# Patient Record
Sex: Female | Born: 1955 | Race: White | Hispanic: No | Marital: Married | Smoking: Never smoker
Health system: Southern US, Community
[De-identification: ages and names within clinical notes are randomized; demographics above are authoritative.]

## PROBLEM LIST (undated history)

## (undated) DIAGNOSIS — F329 Major depressive disorder, single episode, unspecified: Secondary | ICD-10-CM

## (undated) DIAGNOSIS — M2550 Pain in unspecified joint: Secondary | ICD-10-CM

## (undated) DIAGNOSIS — F419 Anxiety disorder, unspecified: Secondary | ICD-10-CM

## (undated) DIAGNOSIS — Z9889 Other specified postprocedural states: Secondary | ICD-10-CM

## (undated) DIAGNOSIS — C50919 Malignant neoplasm of unspecified site of unspecified female breast: Secondary | ICD-10-CM

## (undated) DIAGNOSIS — G709 Myoneural disorder, unspecified: Secondary | ICD-10-CM

## (undated) DIAGNOSIS — Z98811 Dental restoration status: Secondary | ICD-10-CM

## (undated) DIAGNOSIS — T7840XA Allergy, unspecified, initial encounter: Secondary | ICD-10-CM

## (undated) DIAGNOSIS — R350 Frequency of micturition: Secondary | ICD-10-CM

## (undated) DIAGNOSIS — M8580 Other specified disorders of bone density and structure, unspecified site: Secondary | ICD-10-CM

## (undated) DIAGNOSIS — E079 Disorder of thyroid, unspecified: Secondary | ICD-10-CM

## (undated) DIAGNOSIS — F32A Depression, unspecified: Secondary | ICD-10-CM

## (undated) DIAGNOSIS — K635 Polyp of colon: Secondary | ICD-10-CM

## (undated) DIAGNOSIS — C801 Malignant (primary) neoplasm, unspecified: Secondary | ICD-10-CM

## (undated) DIAGNOSIS — E039 Hypothyroidism, unspecified: Secondary | ICD-10-CM

## (undated) DIAGNOSIS — G35 Multiple sclerosis: Secondary | ICD-10-CM

## (undated) DIAGNOSIS — R112 Nausea with vomiting, unspecified: Secondary | ICD-10-CM

## (undated) HISTORY — PX: LAPAROSCOPIC ABDOMINAL EXPLORATION: SHX6249

## (undated) HISTORY — DX: Other specified disorders of bone density and structure, unspecified site: M85.80

## (undated) HISTORY — DX: Multiple sclerosis: G35

## (undated) HISTORY — DX: Polyp of colon: K63.5

## (undated) HISTORY — PX: POLYPECTOMY: SHX149

## (undated) HISTORY — DX: Allergy, unspecified, initial encounter: T78.40XA

## (undated) HISTORY — DX: Disorder of thyroid, unspecified: E07.9

## (undated) HISTORY — DX: Myoneural disorder, unspecified: G70.9

## (undated) HISTORY — DX: Malignant neoplasm of unspecified site of unspecified female breast: C50.919

## (undated) HISTORY — DX: Depression, unspecified: F32.A

## (undated) HISTORY — DX: Major depressive disorder, single episode, unspecified: F32.9

---

## 2013-07-01 ENCOUNTER — Ambulatory Visit: Payer: 59 | Attending: Psychiatry | Admitting: Physical Therapy

## 2013-07-01 DIAGNOSIS — R269 Unspecified abnormalities of gait and mobility: Secondary | ICD-10-CM | POA: Insufficient documentation

## 2013-07-01 DIAGNOSIS — IMO0001 Reserved for inherently not codable concepts without codable children: Secondary | ICD-10-CM | POA: Insufficient documentation

## 2013-07-01 DIAGNOSIS — M6281 Muscle weakness (generalized): Secondary | ICD-10-CM | POA: Insufficient documentation

## 2013-07-08 ENCOUNTER — Ambulatory Visit: Payer: 59 | Admitting: Physical Therapy

## 2013-07-10 ENCOUNTER — Ambulatory Visit: Payer: 59 | Admitting: Physical Therapy

## 2013-07-13 ENCOUNTER — Ambulatory Visit: Payer: 59 | Admitting: Physical Therapy

## 2013-07-15 ENCOUNTER — Ambulatory Visit: Payer: 59 | Admitting: Physical Therapy

## 2013-07-21 ENCOUNTER — Ambulatory Visit: Payer: 59 | Attending: Psychiatry | Admitting: Physical Therapy

## 2013-07-21 DIAGNOSIS — R269 Unspecified abnormalities of gait and mobility: Secondary | ICD-10-CM | POA: Insufficient documentation

## 2013-07-21 DIAGNOSIS — M6281 Muscle weakness (generalized): Secondary | ICD-10-CM | POA: Insufficient documentation

## 2013-07-24 ENCOUNTER — Ambulatory Visit: Payer: 59 | Admitting: Physical Therapy

## 2013-07-27 ENCOUNTER — Ambulatory Visit: Payer: 59 | Admitting: Physical Therapy

## 2013-07-29 ENCOUNTER — Ambulatory Visit: Payer: 59 | Admitting: Physical Therapy

## 2013-08-03 ENCOUNTER — Ambulatory Visit: Payer: 59 | Admitting: Physical Therapy

## 2013-08-05 ENCOUNTER — Ambulatory Visit: Payer: 59 | Admitting: Physical Therapy

## 2014-01-28 ENCOUNTER — Encounter: Payer: Self-pay | Admitting: Internal Medicine

## 2014-01-28 ENCOUNTER — Other Ambulatory Visit: Payer: Self-pay | Admitting: Internal Medicine

## 2014-01-28 DIAGNOSIS — Z1231 Encounter for screening mammogram for malignant neoplasm of breast: Secondary | ICD-10-CM

## 2014-02-10 ENCOUNTER — Ambulatory Visit: Payer: 59

## 2014-02-17 ENCOUNTER — Ambulatory Visit
Admission: RE | Admit: 2014-02-17 | Discharge: 2014-02-17 | Disposition: A | Discharge status: Home / Self Care | Payer: 59 | Source: Ambulatory Visit | Attending: Internal Medicine | Admitting: Internal Medicine

## 2014-02-17 DIAGNOSIS — Z1231 Encounter for screening mammogram for malignant neoplasm of breast: Secondary | ICD-10-CM

## 2014-03-08 ENCOUNTER — Ambulatory Visit (AMBULATORY_SURGERY_CENTER): Payer: Self-pay | Admitting: *Deleted

## 2014-03-08 ENCOUNTER — Telehealth: Payer: Self-pay | Admitting: *Deleted

## 2014-03-08 VITALS — Ht 62.0 in | Wt 122.4 lb

## 2014-03-08 DIAGNOSIS — Z1211 Encounter for screening for malignant neoplasm of colon: Secondary | ICD-10-CM

## 2014-03-08 MED ORDER — MOVIPREP 100 G PO SOLR: 1.0000 | Freq: Once | ORAL | Status: DC

## 2014-03-08 NOTE — Telephone Encounter (Signed)
Dr Henrene Pastor,  I saw Mrs Wartman in Pre Visit this am. She is concerned about her colonoscopy sedation with diprovan as she had nausea and vomiting with diprovan when she had a laparoscopy. She is uncertain if they added fentanyl to her sedation at that time. She wanted to know if she could have something for nausea prior to her colon or if the CRNA could give her something prior to her procedure IV.   Thanks for your time.   Please advise.  Lelan Pons Pre Visit

## 2014-03-08 NOTE — Telephone Encounter (Signed)
No problem. If somebody reminds me, we'll give her some zofran. Thanks

## 2014-03-08 NOTE — Progress Notes (Signed)
No egg or soy allergy. ewm No diet pills. ewm No blood thinners. ewm No issues with past sedation except w/ her laparoscopy she had nausea vomiting and she knows she had diprovan but unsure if fentanyl was added. ewm No home 02 use. ewm Pt declined emmi. ewm

## 2014-03-08 NOTE — Telephone Encounter (Signed)
Attached pt info sheet to pt's chart to remind That dr .Henrene Pastor. Said zofran ok before her colon. Also called and left message on pt's answering machine of such as directed to do so by pt today in [facility]  [facility]

## 2014-03-17 ENCOUNTER — Encounter: Payer: Self-pay | Admitting: Internal Medicine

## 2014-03-25 ENCOUNTER — Ambulatory Visit (AMBULATORY_SURGERY_CENTER): Payer: 59 | Admitting: Internal Medicine

## 2014-03-25 ENCOUNTER — Encounter: Payer: Self-pay | Admitting: Internal Medicine

## 2014-03-25 VITALS — BP 122/71 | HR 59 | Temp 99.0°F | Resp 16 | Ht 62.0 in | Wt 122.0 lb

## 2014-03-25 DIAGNOSIS — K635 Polyp of colon: Secondary | ICD-10-CM

## 2014-03-25 DIAGNOSIS — D122 Benign neoplasm of ascending colon: Secondary | ICD-10-CM

## 2014-03-25 DIAGNOSIS — Z1211 Encounter for screening for malignant neoplasm of colon: Secondary | ICD-10-CM

## 2014-03-25 HISTORY — PX: COLONOSCOPY WITH PROPOFOL: SHX5780

## 2014-03-25 MED ORDER — SODIUM CHLORIDE 0.9 % IV SOLN: 500.0000 mL | INTRAVENOUS | Status: DC

## 2014-03-25 NOTE — Progress Notes (Signed)
Report to PACU, RN, vss, BBS= Clear.  

## 2014-03-25 NOTE — Progress Notes (Signed)
Called to room to assist during endoscopic procedure.  Patient ID and intended procedure confirmed with present staff. Received instructions for my participation in the procedure from the performing physician.  

## 2014-03-25 NOTE — Patient Instructions (Signed)
YOU HAD AN ENDOSCOPIC PROCEDURE TODAY AT THE [facility]: Refer to the procedure report that was given to you for any specific questions about what was found during the examination.  If the procedure report does not answer your questions, please call your gastroenterologist to clarify.  If you requested that your care partner not be given the details of your procedure findings, then the procedure report has been included in a sealed envelope for you to review at your convenience later.  YOU SHOULD EXPECT: Some feelings of bloating in the abdomen. Passage of more gas than usual.  Walking can help get rid of the air that was put into your GI tract during the procedure and reduce the bloating. If you had a lower endoscopy (such as a colonoscopy or flexible sigmoidoscopy) you may notice spotting of blood in your stool or on the toilet paper. If you underwent a bowel prep for your procedure, then you may not have a normal bowel movement for a few days.  DIET: Your first meal following the procedure should be a light meal and then it is ok to progress to your normal diet.  A half-sandwich or bowl of soup is an example of a good first meal.  Heavy or fried foods are harder to digest and may make you feel nauseous or bloated.  Likewise meals heavy in dairy and vegetables can cause extra gas to form and this can also increase the bloating.  Drink plenty of fluids but you should avoid alcoholic beverages for 24 hours.  ACTIVITY: Your care partner should take you home directly after the procedure.  You should plan to take it easy, moving slowly for the rest of the day.  You can resume normal activity the day after the procedure however you should NOT DRIVE or use heavy machinery for 24 hours (because of the sedation medicines used during the test).    SYMPTOMS TO REPORT IMMEDIATELY: A gastroenterologist can be reached at any hour.  During normal business hours, 8:30 AM to 5:00 PM Monday through Friday,  call [phone].  After hours and on weekends, please call the GI answering service at [phone] who will take a message and have the physician on call contact you.   Following lower endoscopy (colonoscopy or flexible sigmoidoscopy):  Excessive amounts of blood in the stool  Significant tenderness or worsening of abdominal pains  Swelling of the abdomen that is new, acute  Fever of 100F or higher    FOLLOW UP: If any biopsies were taken you will be contacted by phone or by letter within the next 1-3 weeks.  Call your gastroenterologist if you have not heard about the biopsies in 3 weeks.  Our staff will call the home number listed on your records the next business day following your procedure to check on you and address any questions or concerns that you may have at that time regarding the information given to you following your procedure. This is a courtesy call and so if there is no answer at the home number and we have not heard from you through the emergency physician on call, we will assume that you have returned to your regular daily activities without incident.  SIGNATURES/CONFIDENTIALITY: You and/or your care partner have signed paperwork which will be entered into your electronic medical record.  These signatures attest to the fact that that the information above on your After Visit Summary has been reviewed and is understood.  Full responsibility of the confidentiality   of this discharge information lies with you and/or your care-partner.     

## 2014-03-25 NOTE — Op Note (Signed)
[place]  [address] [place], [address]   COLONOSCOPY PROCEDURE REPORT  PATIENT: Amanda Li, Amanda Li  MR#: [id number] BIRTHDATE: July 19, 1955 , 43  yrs. old GENDER: female ENDOSCOPIST: Eustace Quail, MD REFERRED BY:W.  Lutricia Feil, M.D. PROCEDURE DATE:  03/25/2014 PROCEDURE:   Colonoscopy with snare polypectomy x 2 First Screening Colonoscopy - Avg.  risk and is 50 yrs.  old or older Yes.  Prior Negative Screening - Now for repeat screening. N/A  History of Adenoma - Now for follow-up colonoscopy & has been > or = to 3 yrs.  N/A  Polyps Removed Today? Yes. ASA CLASS:   Class II INDICATIONS:average risk for colorectal cancer. MEDICATIONS: Monitored anesthesia care and Propofol 290 mg IV  DESCRIPTION OF PROCEDURE:   After the risks benefits and alternatives of the procedure were thoroughly explained, informed consent was obtained.  The digital rectal exam revealed no abnormalities of the rectum.   The [id number]  endoscope was introduced through the anus and advanced to the cecum, which was identified by both the appendix and ileocecal valve. No adverse events experienced.   The quality of the prep was excellent, using MoviPrep  The instrument was then slowly withdrawn as the colon was fully examined.    COLON FINDINGS: Two polyps, similar appearance, measuring 1 mm in size were found in the ascending colon.  A polypectomy was performed with a cold snare.  The resection was complete, in one of two, the polyp tissue was completely retrieved and sent to histology.   The examination was otherwise normal.  Retroflexed views revealed no abnormalities. The time to cecum=4 minutes 23 seconds.  Withdrawal time=12 minutes 29 seconds.  The scope was withdrawn and the procedure completed.  COMPLICATIONS: There were no immediate complications.  ENDOSCOPIC IMPRESSION: 1.   Two polyps measuring 1 mm in size were found in the ascending colon; polypectomy was  performed with a cold snare 2.   The examination was otherwise normal  RECOMMENDATIONS: 1. Repeat colonoscopy in 5 years if polyp adenomatous; otherwise 10 years  eSigned:  Eustace Quail, MD 03/25/2014 12:41 PM   cc: Janalyn Rouse, MD and The Patient

## 2014-03-26 ENCOUNTER — Telehealth: Payer: Self-pay | Admitting: *Deleted

## 2014-03-26 NOTE — Telephone Encounter (Signed)
Left message that we called for f/u 

## 2014-03-30 ENCOUNTER — Encounter: Payer: Self-pay | Admitting: Internal Medicine

## 2014-12-02 ENCOUNTER — Encounter: Payer: Self-pay | Admitting: Physical Therapy

## 2014-12-02 ENCOUNTER — Ambulatory Visit: Payer: 59 | Attending: Internal Medicine | Admitting: Physical Therapy

## 2014-12-02 DIAGNOSIS — R293 Abnormal posture: Secondary | ICD-10-CM

## 2014-12-02 DIAGNOSIS — R269 Unspecified abnormalities of gait and mobility: Secondary | ICD-10-CM

## 2014-12-02 DIAGNOSIS — R2681 Unsteadiness on feet: Secondary | ICD-10-CM | POA: Diagnosis present

## 2014-12-02 DIAGNOSIS — R531 Weakness: Secondary | ICD-10-CM

## 2014-12-02 DIAGNOSIS — M21372 Foot drop, left foot: Secondary | ICD-10-CM | POA: Insufficient documentation

## 2014-12-02 NOTE — Therapy (Signed)
[facility] [address] [place], [place], [address] Phone: [phone]   Fax:  [phone]  Physical Therapy Evaluation  Patient Details  Name: Amanda Li MRN: [id number] Date of Birth: [birth year] Referring Provider:  Marton Redwood, MD  Encounter Date: 12/02/2014      PT End of Session - 12/02/14 1349    Visit Number 1   Number of Visits 17  eval + 16 visits   Date for PT Re-Evaluation 01/31/15   PT Start Time 0930   PT Stop Time 1015   PT Time Calculation (min) 45 min   Equipment Utilized During Treatment Gait belt   Activity Tolerance Patient tolerated treatment well   Behavior During Therapy [facility] for tasks assessed/performed      Past Medical History  Diagnosis Date  . Allergy     seasonal  . Depression   . Neuromuscular disorder     MS  . Thyroid disease     hypothyroid  . Multiple sclerosis     Past Surgical History  Procedure Laterality Date  . Cesarean section      x2  . Laparoscopic abdominal exploration      for endometriosis    There were no vitals filed for this visit.  Visit Diagnosis:  Abnormality of gait  Abnormal posture  Generalized weakness  Unsteadiness      Subjective Assessment - 12/02/14 0940    Subjective Patient diagnosed with MS in 1988. Most recently, pt reporting episodes of back "locking up" (demonstrated thoracolumbar flexion, R lateral flexion) which has become increasingly more frequent witihn the past 6 months. Pt states, "I've pretty much given up on walking distances." Pt also reports that legs "quiver" when pt is bendning over to pick up object from floor/lower height. Pt sustained facial laceration secondary to fall 2 months ago, which occurred when pt was walking over uneven ground.   Patient is accompained by: Family member  daughter, Wilburn Cornelia   Pertinent History MS (diagnosed in 1988), hypothyroidism, depression   Limitations Walking;House hold  activities   Patient Stated Goals "Anything that can help me walk easier and not get that leaning to one side."   Currently in Pain? No/denies            [facility] PT Assessment - 12/02/14 0001    Assessment   Medical Diagnosis Multiple sclerosis   Onset Date/Surgical Date 05/21/86   Precautions   Precautions Fall   Restrictions   Weight Bearing Restrictions No   Balance Screen   Has the patient fallen in the past 6 months Yes   How many times? 1   Has the patient had a decrease in activity level because of a fear of falling?  Yes   Is the patient reluctant to leave their home because of a fear of falling?  No   Home Environment   Living Environment Private residence   Living Arrangements Spouse/significant other;Children   Available Help at Discharge Family   Type of [place] to enter   Entrance Stairs-Number of Steps 2   [place] - single point   Cognition   Overall Cognitive Status Impaired/Different from baseline   Observation/Other Assessments   Focus on Therapeutic Outcomes (FOTO)  NeuroQOL LE: 34.3   Sensation   Light Touch Appears Intact   Coordination   Gross Motor Movements are Fluid and Coordinated No   Heel Shin Test Smoothness of movement  on LLE impaired as compared with RLE   Posture/Postural Control   Posture/Postural Control Postural limitations   Postural Limitations Rounded Shoulders;Increased thoracic kyphosis   Posture Comments L lateral flexion of cervical spine; lateral trunk shift to R side; bears most of weight on R hip   ROM / Strength   AROM / PROM / Strength Strength   Strength   Overall Strength Deficits   Overall Strength Comments Grossly 4-/5 in L hip, 4/5 in R hip; 4/5 in L knee/ankle in al planes   Bed Mobility   Bed Mobility Supine to Sit;Sit to Supine   Supine to Sit 6: Modified independent (Device/Increase time)   Sit to Supine 6: Modified independent (Device/Increase  time)   Transfers   Transfers Sit to Stand;Stand to Sit   Sit to Stand 6: Modified independent (Device/Increase time)   Stand to Sit 6: Modified independent (Device/Increase time)   Ambulation/Gait   Ambulation/Gait Yes   Ambulation/Gait Assistance 5: Supervision;4: Min guard   Ambulation/Gait Assistance Details min guard with turning, dual tasking, head turning, increased external demands   Ambulation Distance (Feet) 230 Feet   Assistive device None   Gait Pattern Step-through pattern;Decreased dorsiflexion - left;Left genu recurvatum;Lateral hip instability;Trendelenburg;Lateral trunk lean to right  L Trendelenburh with increased distance ambulated   Ambulation Surface Level;Indoor   Gait velocity 2.94 ft/sec   Stairs Yes   Stairs Assistance 4: Min guard   Stair Management Technique Two rails;Alternating pattern;Forwards   Number of Stairs 4   Height of Stairs 6   Standardized Balance Assessment   Standardized Balance Assessment Dynamic Gait Index   Dynamic Gait Index   Level Surface Mild Impairment   Change in Gait Speed Mild Impairment   Gait with Horizontal Head Turns Moderate Impairment   Gait with Vertical Head Turns Mild Impairment   Gait and Pivot Turn Severe Impairment   Step Over Obstacle Mild Impairment   Step Around Obstacles Mild Impairment   Steps Mild Impairment   Total Score 13                           PT Education - 12/02/14 1349    Education provided Yes   Education Details goals, findings, POC.   Person(s) Educated Patient   Methods Explanation   Comprehension Verbalized understanding          PT Short Term Goals - 12/02/14 2221    PT SHORT TERM GOAL #1   Title Pt will perform home exercises with mod I using paper handout to indicate safe daily compliance with HEP. Target date: 12/30/14.   Status New   PT SHORT TERM GOAL #2   Title Pt will demonstrate ability to self-correct posture with subtle cueing to indicate improved  postural awareness. Target date: 12/30/14.   Status New   PT SHORT TERM GOAL #3   Title Pt will increase Dynamic Gait Index score from 13/24 to 17/24 to indicate increased gait stability in presence of external demands. Target date: 12/30/14.   Status New   PT SHORT TERM GOAL #4   Title Pt will negotiate 2 stairs with single L rail and mod I, increased time to enable pt to use all home entrances. Target date: 12/30/14.   Status New           PT Long Term Goals - 12/02/14 2227    PT LONG TERM GOAL #1   Title Pt will increase self-selected gait speed  from 2.94 ft/sec to 3.74 ft/sec to indicate increased efficiency of ambulation. Target date: 01/27/15.   Status New   PT LONG TERM GOAL #2   Title Pt will increase Dynamic Gait Index score from 13 to 20/24 to indicate decreased risk for falls. Target date: 01/27/15.   Status New   PT LONG TERM GOAL #3   Title Pt will ambulate 500' over unlevel, outdoor surfaces with mod I using LRAD to indicate increased stability/independence with community mobility. Target date: 01/27/15.   Status New   PT LONG TERM GOAL #4   Title Pt will independently negotiate curb step and outdoor inclined/declined surfaces with no overt LOB to indicate safety traversing community obstacles. Target date: 01/27/15.   Status New   PT LONG TERM GOAL #5   Title Pt will increase Neuro QoL Lower Extremity score >/= 10 points from baseline to indicate improved quality of life related to LE function. Target date: 01/27/15.   Baseline NeuroQOL LE: 34.3   Status New               Plan - 12/02/14 2203    Clinical Impression Statement Pt is a 59 y/o F with history of multiple sclerosis (diagnoses in 1988) presenting to outpatient PT due to impaired balance, gait instability, and increasingly more impaired postural alignment over the past 6 months. PT evaluation reveals the following functional impairments:gait deviations, abnormal postural alignment/control, weakness (LLE > RLE),  decreased LE motor control, Dynamic Gait Index score indicative of decreased gait stability in presence of external demands; decreased QoL related to functional mobility as demonstrated by Neuro QoL LE score; unsteadiness, as pt has sustained injuries secondary to fall within the past 6 months. Pt will benefit from skilled outpatient PT twice per week for 8 weeks to address said impairments.   Pt will benefit from skilled therapeutic intervention in order to improve on the following deficits Abnormal gait;Decreased activity tolerance;Decreased balance;Decreased coordination;Decreased strength;Impaired perceived functional ability;Improper body mechanics;Postural dysfunction;Decreased mobility;Decreased cognition   Rehab Potential Excellent   PT Frequency 2x / week   PT Duration 8 weeks   PT Treatment/Interventions ADLs/Self Care Home Management;Vestibular;Functional mobility training;Stair training;Gait training;Therapeutic activities;Therapeutic exercise;Balance training;Neuromuscular re-education;Patient/family education;Orthotic Fit/Training;Passive range of motion;Manual techniques;Energy conservation   PT Next Visit Plan Establish HEP; ask about use of L heel wedge to control genu recurvatum. Increase pt awareness of pelvic obliquity, postural alignment. Pelvic PNF.   PT Home Exercise Plan Strengthening of L hip abductors, extensors; resisted L TKE to control genu recurvatum. Stretching to increase symmetry of alignment.   Recommended Other Services Per pt report of recent cognitive changes, pt to consider SLP consult for to address cognition.   Consulted and Agree with Plan of Care Patient;Family member/caregiver   Family Member Consulted daughter, Wilburn Cornelia         Problem List There are no active problems to display for this patient.   Billie Ruddy, PT, DPT [facility] [address] [place], [place], [address] Phone: [phone]   Fax:   [phone] 12/02/2014, 10:41 PM

## 2014-12-07 ENCOUNTER — Ambulatory Visit: Payer: 59 | Admitting: Physical Therapy

## 2014-12-07 DIAGNOSIS — R2681 Unsteadiness on feet: Secondary | ICD-10-CM

## 2014-12-07 DIAGNOSIS — R269 Unspecified abnormalities of gait and mobility: Secondary | ICD-10-CM | POA: Diagnosis not present

## 2014-12-07 DIAGNOSIS — R293 Abnormal posture: Secondary | ICD-10-CM

## 2014-12-07 DIAGNOSIS — R531 Weakness: Secondary | ICD-10-CM

## 2014-12-07 NOTE — Therapy (Signed)
[facility] [address] [place], [place], [address] Phone: [phone]   Fax:  [phone]  Physical Therapy Treatment  Patient Details  Name: Amanda Li MRN: [id number] Date of Birth: [birth year] Referring Provider:  Marton Redwood, MD  Encounter Date: 12/07/2014      PT End of Session - 12/07/14 1606    Visit Number 2   Number of Visits 17   Date for PT Re-Evaluation 01/31/15   Authorization Type [employer]   PT Start Time 1455   PT Stop Time 1536   PT Time Calculation (min) 41 min   Equipment Utilized During Treatment Gait belt   Activity Tolerance Patient tolerated treatment well   Behavior During Therapy [facility] for tasks assessed/performed      Past Medical History  Diagnosis Date  . Allergy     seasonal  . Depression   . Neuromuscular disorder     MS  . Thyroid disease     hypothyroid  . Multiple sclerosis     Past Surgical History  Procedure Laterality Date  . Cesarean section      x2  . Laparoscopic abdominal exploration      for endometriosis    There were no vitals filed for this visit.  Visit Diagnosis:  Abnormality of gait  Abnormal posture  Generalized weakness  Unsteadiness      Subjective Assessment - 12/07/14 1502    Subjective Pt reporting R ankle pain (see below), which orthopedist diagnosed as posterior tibial tendon dysfunction 2 years ago. Pt reporting incresed fatigue today, stating, "My husband won't let me put the thermostat below 78 degrees. He knows the effect it has on m, bbut doesn't want to spend the money."    Pertinent History MS (diagnosed in 1988), hypothyroidism, depression   Limitations Walking;House hold activities   Patient Stated Goals "Anything that can help me walk easier and not get that leaning to one side."   Currently in Pain? Yes   Pain Score 5    Pain Location Ankle   Pain Orientation Right;Medial;Posterior   Pain Descriptors /  Indicators Sharp   Pain Type Chronic pain   Pain Onset More than a month ago  at least 2 years   Pain Frequency Intermittent   Aggravating Factors  Worst in the morning   Pain Relieving Factors Walking, taking Aleve         Treatment   Therapeutic Exercises: - Instructed pt in home exercises. See Pt Instructions for details. Pt performed said exercises with verbal, demonstration cueing for proper technique. All repetitions performed to pt fatigue. See Patient Instructions for rep/set number, further detail on all exercises. - During VOR x1 viewing with horizontal and vertical head turns, pt reporting blurring of visual target. Pt also reporting diplopia with head turn to R (endrange).                    [facility] PT Treatment/Exercise - 12/07/14 1603    Bed Mobility   Bed Mobility Supine to Sit;Sit to Supine   Supine to Sit 6: Modified independent (Device/Increase time)   Sit to Supine 6: Modified independent (Device/Increase time)   Transfers   Transfers Sit to Stand;Stand to Sit   Sit to Stand 6: Modified independent (Device/Increase time)   Stand to Sit 6: Modified independent (Device/Increase time)   Ambulation/Gait   Ambulation/Gait Yes   Ambulation/Gait Assistance 5: Supervision;4: Min guard   Ambulation Distance (Feet) 180 Feet  Assistive device None   Gait Pattern Step-through pattern;Decreased dorsiflexion - left;Left genu recurvatum;Lateral hip instability;Trendelenburg;Lateral trunk lean to right;Trunk rotated posteriorly on left;Decreased hip/knee flexion - right;Decreased hip/knee flexion - left;Trunk flexed;Poor foot clearance - left  L Trendelenburg; B hips internally rotated   Ambulation Surface Level;Indoor   Posture/Postural Control   Posture/Postural Control Postural limitations   Postural Limitations Rounded Shoulders;Increased thoracic kyphosis   Posture Comments L lateral flexion of cervical spine; lateral trunk shift to R side; bears most of  weight on R hip                PT Education - 12/07/14 1603    Education provided Yes   Education Details HEP. See pt instructions.   Person(s) Educated Patient   Methods Explanation;Demonstration;Verbal cues;Handout   Comprehension Verbalized understanding;Returned demonstration          PT Short Term Goals - 12/07/14 1613    PT SHORT TERM GOAL #1   Title Pt will perform home exercises with mod I using paper handout to indicate safe daily compliance with HEP. Target date: 12/30/14.   Status On-going   PT SHORT TERM GOAL #2   Title Pt will demonstrate ability to self-correct posture with subtle cueing to indicate improved postural awareness. Target date: 12/30/14.   Status On-going   PT SHORT TERM GOAL #3   Title Pt will increase Dynamic Gait Index score from 13/24 to 17/24 to indicate increased gait stability in presence of external demands. Target date: 12/30/14.   Status On-going   PT SHORT TERM GOAL #4   Title Pt will negotiate 2 stairs with single L rail and mod I, increased time to enable pt to use all home entrances. Target date: 12/30/14.   Status On-going           PT Long Term Goals - 12/07/14 1613    PT LONG TERM GOAL #1   Title Pt will increase self-selected gait speed from 2.94 ft/sec to 3.74 ft/sec to indicate increased efficiency of ambulation. Target date: 01/27/15.   Status On-going   PT LONG TERM GOAL #2   Title Pt will increase Dynamic Gait Index score from 13 to 20/24 to indicate decreased risk for falls. Target date: 01/27/15.   Status On-going   PT LONG TERM GOAL #3   Title Pt will ambulate 500' over unlevel, outdoor surfaces with mod I using LRAD to indicate increased stability/independence with community mobility. Target date: 01/27/15.   Status On-going   PT LONG TERM GOAL #4   Title Pt will independently negotiate curb step and outdoor inclined/declined surfaces with no overt LOB to indicate safety traversing community obstacles. Target date:  01/27/15.   Status On-going   PT LONG TERM GOAL #5   Title Pt will increase Neuro QoL Lower Extremity score >/= 10 points from baseline to indicate improved quality of life related to LE function. Target date: 01/27/15.   Baseline NeuroQOL LE: 34.3   Status On-going               Plan - 12/07/14 1608    Clinical Impression Statement HEP established; focused B hip strengthening, L knee control (to prevent genu recurvatum), and gaze stabilization. Noted increased gait instability today as compared with previous sessions. Continue per POC.   Pt will benefit from skilled therapeutic intervention in order to improve on the following deficits Abnormal gait;Decreased activity tolerance;Decreased balance;Decreased coordination;Decreased strength;Impaired perceived functional ability;Improper body mechanics;Postural dysfunction;Decreased mobility;Decreased cognition   Rehab Potential Excellent  PT Frequency 2x / week   PT Duration 8 weeks   PT Treatment/Interventions ADLs/Self Care Home Management;Vestibular;Functional mobility training;Stair training;Gait training;Therapeutic activities;Therapeutic exercise;Balance training;Neuromuscular re-education;Patient/family education;Orthotic Fit/Training;Passive range of motion;Manual techniques;Energy conservation   PT Next Visit Plan Check HEP performance. Increase pt awareness of pelvic obliquity, postural alignment.   PT Home Exercise Plan See Pt Instructions for 7/19.   Recommended Other Services Consider talking to pt about L AFO due to poor L foot clearance on 7/19.   Consulted and Agree with Plan of Care Patient        Problem List There are no active problems to display for this patient.   Billie Ruddy, PT, [facility] [address] [place], [place], [address] Phone: [phone]   Fax:  [phone] 12/07/2014, 4:14 PM

## 2014-12-07 NOTE — Patient Instructions (Addendum)
Abduction: Clam (Eccentric) - Side-Lying   Lie on side with knees bent. Lift top knee, keeping feet together. Keep trunk steady. Slowly lower for 3-5 seconds.12 reps on the LEFT leg; 15 reps on the RIGHT leg.    Quad Strength: Terminal Knee Extension With Tubing   Stand in front of a stable surface (kitchen countertop or stair railing). Anchor the GREEN theraband to the stable surface. With theraband just above LEFT knee. Squeeze buttocks and use thigh muscles to straighten knee. Do 15 repetitions, 2 times per day. Then, turn around 180 degrees and place a stable chair in front of you. Do the same exercise, but perform this 8 times. Do 3 sets per day. May use a mirror to help to control knee straightening. http://cc.exer.[email or web address]    Abductor Strength: Bridge Pose (Strap)   Place GREEN Theraband around thighs. Perform bridge while pressing into strap with knees. Hold 1-2 seconds then slowly lower. Perform 3 sets total: 12 reps for first trial, 10 reps for second, and 8 reps for third.      Gaze Stabilization: Tip Card 1.Target must remain in focus, not blurry, and appear stationary while head is in motion. 2.Perform exercises with small head movements (45 to either side of midline). 3.Increase speed of head motion so long as target is in focus. 4.If you wear eyeglasses, be sure you can see target through lens (therapist will give specific instructions for bifocal / progressive lenses). 5.These exercises may provoke dizziness or nausea. Work through these symptoms. If too dizzy, slow head movement slightly. Rest between each exercise. 6.Exercises demand concentration; avoid distractions. 7.For safety, perform standing exercises close to a counter, wall, corner, or next to someone.  Gaze Stabilization: Standing Feet Apart   Standing with a stable chair in front of you, tape your visual target ("A") arm's length away from you. The "A" should be just below eye-level. While keeping eyes  on target on wall 3 feet away, tilt head down slightly and move head side to side for 30 seconds. Repeat while moving head up and down for 30 seconds.  Do 2 sessions per day.

## 2014-12-09 ENCOUNTER — Ambulatory Visit: Payer: 59 | Admitting: Physical Therapy

## 2014-12-09 DIAGNOSIS — R269 Unspecified abnormalities of gait and mobility: Secondary | ICD-10-CM

## 2014-12-09 DIAGNOSIS — R531 Weakness: Secondary | ICD-10-CM

## 2014-12-09 NOTE — Therapy (Signed)
[facility] [address] [place], [place], [address] Phone: [phone]   Fax:  [phone]  Physical Therapy Treatment  Patient Details  Name: Amanda Li MRN: [id number] Date of Birth: 01-29-1956 Referring Provider:  Marton Redwood, MD  Encounter Date: 12/09/2014      PT End of Session - 12/09/14 1636    Visit Number 3   Number of Visits 17   Date for PT Re-Evaluation 01/31/15   Authorization Type [employer]   PT Start Time 1447   PT Stop Time 1536   PT Time Calculation (min) 49 min   Equipment Utilized During Treatment Gait belt   Activity Tolerance Patient tolerated treatment well   Behavior During Therapy [facility] for tasks assessed/performed      Past Medical History  Diagnosis Date  . Allergy     seasonal  . Depression   . Neuromuscular disorder     MS  . Thyroid disease     hypothyroid  . Multiple sclerosis     Past Surgical History  Procedure Laterality Date  . Cesarean section      x2  . Laparoscopic abdominal exploration      for endometriosis    There were no vitals filed for this visit.  Visit Diagnosis:  Abnormality of gait  Generalized weakness      Subjective Assessment - 12/09/14 1450    Subjective Pt denies falls. No pain in R ankle at this time. Saw neurologist since last session. Expresses discouragement that "things are just kind of staying the same" in terms of progress with MS.   Pertinent History Secondary progressive multiple sclerosis (MS diagnosed in 1988), hypothyroidism, depression   Limitations Walking;House hold activities   Patient Stated Goals "Anything that can help me walk easier and not get that leaning to one side."   Currently in Pain? No/denies      Treatment   Gait Training: - Focused on trialing L AFO to address poor L foot clearance during ambulation as well as L genu recurvatum. Pt performed gait x115' wearing each of the following braces:  -  No AFO with gait deviations as described below. - L Foot Up brace without simulated toe cap with L toe catch x3 episodes - L Foot Up brace with simulated toe cap with no significant LOB due to L toe drag, but consistent, high velocity L genu recurvatum during final 50' - L PLS AFO (WalkOn) with simulated toe cap, noted improvement in L genu recurvatum (well controlled until final 30' of trial, after which GR was present but slow velocity during L mid stance)  - Trialed L [place] x25' prior to ending trial secondary to pt discomfort. - Unable to trial L Reaction AFO due to no trial braces in pt size in clinic                [facility] Adult PT Treatment/Exercise - 12/09/14 0001    Bed Mobility   Bed Mobility --   Supine to Sit --   Sit to Supine 6: Modified independent (Device/Increase time);7: Independent   Transfers   Transfers Sit to Stand;Stand to Sit   Sit to Stand 6: Modified independent (Device/Increase time)   Stand to Sit 6: Modified independent (Device/Increase time)   Ambulation/Gait   Ambulation/Gait Yes   Ambulation/Gait Assistance 5: Supervision;4: Min guard;4: Min assist   Ambulation Distance (Feet) 450 Feet   Assistive device None   Gait Pattern Step-through pattern;Decreased dorsiflexion - left;Left  genu recurvatum;Lateral hip instability;Trendelenburg;Lateral trunk lean to right;Trunk rotated posteriorly on left;Decreased hip/knee flexion - right;Decreased hip/knee flexion - left;Trunk flexed;Poor foot clearance - left;Poor foot clearance - right;Narrow base of support;Scissoring  L Trendelenburg; B hips internally rotated   Ambulation Surface Level;Indoor   Gait Comments intermittent R toe catch occurred secondary to L Trendelenberg (R hip drop) causing RLE adduction/scissoring   Posture/Postural Control   Posture/Postural Control Postural limitations   Postural Limitations Rounded Shoulders;Increased thoracic kyphosis   Posture Comments L lateral flexion of  cervical spine; lateral trunk shift to R side; bears most of weight on R hip                PT Education - 12/09/14 1551    Education provided Yes   Education Details L AFO and leather toe cap as means of L foot clearance, fall prevention.   Person(s) Educated Patient   Methods Explanation;Demonstration   Comprehension Verbalized understanding          PT Short Term Goals - 12/07/14 1613    PT SHORT TERM GOAL #1   Title Pt will perform home exercises with mod I using paper handout to indicate safe daily compliance with HEP. Target date: 12/30/14.   Status On-going   PT SHORT TERM GOAL #2   Title Pt will demonstrate ability to self-correct posture with subtle cueing to indicate improved postural awareness. Target date: 12/30/14.   Status On-going   PT SHORT TERM GOAL #3   Title Pt will increase Dynamic Gait Index score from 13/24 to 17/24 to indicate increased gait stability in presence of external demands. Target date: 12/30/14.   Status On-going   PT SHORT TERM GOAL #4   Title Pt will negotiate 2 stairs with single L rail and mod I, increased time to enable pt to use all home entrances. Target date: 12/30/14.   Status On-going           PT Long Term Goals - 12/07/14 1613    PT LONG TERM GOAL #1   Title Pt will increase self-selected gait speed from 2.94 ft/sec to 3.74 ft/sec to indicate increased efficiency of ambulation. Target date: 01/27/15.   Status On-going   PT LONG TERM GOAL #2   Title Pt will increase Dynamic Gait Index score from 13 to 20/24 to indicate decreased risk for falls. Target date: 01/27/15.   Status On-going   PT LONG TERM GOAL #3   Title Pt will ambulate 500' over unlevel, outdoor surfaces with mod I using LRAD to indicate increased stability/independence with community mobility. Target date: 01/27/15.   Status On-going   PT LONG TERM GOAL #4   Title Pt will independently negotiate curb step and outdoor inclined/declined surfaces with no overt LOB to  indicate safety traversing community obstacles. Target date: 01/27/15.   Status On-going   PT LONG TERM GOAL #5   Title Pt will increase Neuro QoL Lower Extremity score >/= 10 points from baseline to indicate improved quality of life related to LE function. Target date: 01/27/15.   Baseline NeuroQOL LE: 34.3   Status On-going               Plan - 12/09/14 1616    Clinical Impression Statement Session focused on assessing/addressing poor LLE clearance during gait using L AFO's and simulated leather toe cap. Pt with significant improvement in gait stability/independence, more energy efficient gait pattern when using L PLS AFO ([place] WalkOn) for dorsiflexion assist and for control of L  genu recurvatum. With increased fatigue/distance ambulated, pt exhibited intermittent L toe catch (even when wearing L AFO) due to decreased L hip/knee flexion, fatigue associated with MS. Pt would therefore also benefit from leather toe cap on L shoe to ensure consistent L toe clearance over indoor surfaces. After trial with both AFO and simulated toe cap, pt very encouraged by improvement in gait pattern and requested that this PT contact orthotist. Continue per POC.   Pt will benefit from skilled therapeutic intervention in order to improve on the following deficits Abnormal gait;Decreased activity tolerance;Decreased balance;Decreased coordination;Decreased strength;Impaired perceived functional ability;Improper body mechanics;Postural dysfunction;Decreased mobility;Decreased cognition   Rehab Potential Excellent   PT Frequency 2x / week   PT Duration 8 weeks   PT Treatment/Interventions ADLs/Self Care Home Management;Vestibular;Functional mobility training;Stair training;Gait training;Therapeutic activities;Therapeutic exercise;Balance training;Neuromuscular re-education;Patient/family education;Orthotic Fit/Training;Passive range of motion;Manual techniques;Energy conservation   PT Next Visit Plan Orthotist  consult for L AFO. Check HEP performance. Increase pt awareness of pelvic obliquity, postural alignment.   PT Home Exercise Plan See Pt Instructions on 7/19 for full HEP.   Recommended Other Services Orthotist, Gerald Stabs, planned to be present around 8:15 for 8 am PT session on 7/25.   Consulted and Agree with Plan of Care Patient        Problem List There are no active problems to display for this patient.   Billie Ruddy, PT, DPT [facility] [address] [place], [place], [address] Phone: [phone]   Fax:  [phone] 12/09/2014, 4:37 PM

## 2014-12-13 ENCOUNTER — Telehealth: Payer: Self-pay | Admitting: Physical Therapy

## 2014-12-13 ENCOUNTER — Ambulatory Visit: Payer: 59 | Admitting: Physical Therapy

## 2014-12-13 DIAGNOSIS — R531 Weakness: Secondary | ICD-10-CM

## 2014-12-13 DIAGNOSIS — R269 Unspecified abnormalities of gait and mobility: Secondary | ICD-10-CM

## 2014-12-13 DIAGNOSIS — R293 Abnormal posture: Secondary | ICD-10-CM

## 2014-12-13 DIAGNOSIS — R2681 Unsteadiness on feet: Secondary | ICD-10-CM

## 2014-12-13 NOTE — Telephone Encounter (Signed)
Dr. Brigitte Pulse,  I've been treating Amanda Li in outpatient physical therapy for balance impairments and gait abnormalities associated with multiple sclerosis. Amanda Li has difficulty with left toe clearance during gait and would greatly benefit from a left AFO for fall prevention and energy conservation.  If you agree, please submit an order for a left AFO. Feel free to contact me with any questions.  Thank you,  Billie Ruddy, PT, DPT [facility] [place], [place], [address] Phone: [phone]   Fax:  [phone]

## 2014-12-13 NOTE — Patient Instructions (Addendum)
Abduction: Clam (Eccentric) - Side-Lying   Lie on side with knees bent. Lift top knee, keeping feet together. Keep trunk steady. Slowly lower for 3-5 seconds.10 reps on the LEFT leg; 15 reps on the RIGHT leg.    Quad Strength: Terminal Knee Extension With Tubing   Stand in front of a stable surface (kitchen countertop or stair railing). Anchor the GREEN theraband to the stable surface. With theraband just above LEFT knee. Squeeze buttocks and use thigh muscles to straighten knee. Do 15 repetitions, 2 times per day. Then, turn around 180 degrees and place a stable chair in front of you. Do the same exercise, but perform this 8 times. Do 3 sets per day. May use a mirror to help to control knee straightening. http://cc.exer.us/22    Abductor Strength: Bridge Pose (Strap)   Place GREEN Theraband around thighs. Press outward against the green band; then, lift your hips up toward the ceiling. Hold 1-2 seconds then slowly lower. Perform 3 sets total: 12 reps for first trial, 10 reps for second, and 8 reps for third.      Gaze Stabilization: Tip Card 1.Target must remain in focus, not blurry, and appear stationary while head is in motion. 2.Perform exercises with small head movements (45 to either side of midline). 3.Increase speed of head motion so long as target is in focus. 4.If you wear eyeglasses, be sure you can see target through lens (therapist will give specific instructions for bifocal / progressive lenses). 5.These exercises may provoke dizziness or nausea. Work through these symptoms. If too dizzy, slow head movement slightly. Rest between each exercise. 6.Exercises demand concentration; avoid distractions. 7.For safety, perform standing exercises close to a counter, wall, corner, or next to someone.  Gaze Stabilization: Standing Feet Apart    Take off your progressive lenses. Standing with a stable chair in front of you (to hold onto, if needed), tape your visual target  ("A") arm's length away from you. The "A" should be just below eye-level. While keeping eyes on target, tilt head down slightly and move head from right to left 20 times per direction. Repeat while moving head up and down 20 times per direction. Do 2 sessions per day.   Weight Shift: Anterior / Posterior (Righting / Equilibrium)    Place a stable chair in front of you for safety. BEGIN WITH BACK LEANING AGAINST THE WALL AND FEET 4 INCHES AWAY. Imagine the wall behind you is made of glass; try not to break it. Slowly ove your hips off the wall and come to upright standing.  Hold for 3 seconds.  Return slowly to the wall letting your hips bump the wall and return to stand.   Hold each position __3__ seconds. Repeat _10__ times per session. Do _2_ sessions per day.   Fall Prevention and Home Safety Falls cause injuries and can affect all age groups. It is possible to use preventive measures to significantly decrease the likelihood of falls. There are many simple measures which can make your home safer and prevent falls. OUTDOORS  Repair cracks and edges of walkways and driveways.  Remove high doorway thresholds.  Trim shrubbery on the main path into your home.  Have good outside lighting.  Clear walkways of tools, rocks, debris, and clutter.  Check that handrails are not broken and are securely fastened. Both sides of steps should have handrails.  Have leaves, snow, and ice cleared regularly.  Use sand or salt on walkways during winter months.  In the garage,  clean up grease or oil spills. BATHROOM  Install night lights.  Install grab bars by the toilet and in the tub and shower.  Use non-skid mats or decals in the tub or shower.  Place a plastic non-slip stool in the shower to sit on, if needed.  Keep floors dry and clean up all water on the floor immediately.  Remove soap buildup in the tub or shower on a regular basis.  Secure bath mats with non-slip, double-sided  rug tape.  Remove throw rugs and tripping hazards from the floors. BEDROOMS  Install night lights.  Make sure a bedside light is easy to reach.  Do not use oversized bedding.  Keep a telephone by your bedside.  Have a firm chair with side arms to use for getting dressed.  Remove throw rugs and tripping hazards from the floor. KITCHEN  Keep handles on pots and pans turned toward the center of the stove. Use back burners when possible.  Clean up spills quickly and allow time for drying.  Avoid walking on wet floors.  Avoid hot utensils and knives.  Position shelves so they are not too high or low.  Place commonly used objects within easy reach.  If necessary, use a sturdy step stool with a grab bar when reaching.  Keep electrical cables out of the way.  Do not use floor polish or wax that makes floors slippery. If you must use wax, use non-skid floor wax.  Remove throw rugs and tripping hazards from the floor. STAIRWAYS  Never leave objects on stairs.  Place handrails on both sides of stairways and use them. Fix any loose handrails. Make sure handrails on both sides of the stairways are as long as the stairs.  Check carpeting to make sure it is firmly attached along stairs. Make repairs to worn or loose carpet promptly.  Avoid placing throw rugs at the top or bottom of stairways, or properly secure the rug with carpet tape to prevent slippage. Get rid of throw rugs, if possible.  Have an electrician put in a light switch at the top and bottom of the stairs. OTHER FALL PREVENTION TIPS  Wear low-heel or rubber-soled shoes that are supportive and fit well. Wear closed toe shoes.  When using a stepladder, make sure it is fully opened and both spreaders are firmly locked. Do not climb a closed stepladder.  Add color or contrast paint or tape to grab bars and handrails in your home. Place contrasting color strips on first and last steps.  Learn and use mobility aids as  needed. Install an electrical emergency response system.  Turn on lights to avoid dark areas. Replace light bulbs that burn out immediately. Get light switches that glow.  Arrange furniture to create clear pathways. Keep furniture in the same place.  Firmly attach carpet with non-skid or double-sided tape.  Eliminate uneven floor surfaces.  Select a carpet pattern that does not visually hide the edge of steps.  Be aware of all pets. OTHER HOME SAFETY TIPS  Set the water temperature for 120 F (48.8 C).  Keep emergency numbers on or near the telephone.  Keep smoke detectors on every level of the home and near sleeping areas. Document Released: 04/27/2002 Document Revised: 11/06/2011 Document Reviewed: 07/27/2011 [facility] Patient Information 2015 [place], [place]. This information is not intended to replace advice given to you by your health care provider. Make sure you discuss any questions you have with your health care provider.

## 2014-12-13 NOTE — Therapy (Signed)
[facility] [address] [place], [place], [address] Phone: [phone]   Fax:  [phone]  Physical Therapy Treatment  Patient Details  Name: Amanda Li MRN: [id number] Date of Birth: Mar 30, 1956 Referring Provider:  Marton Redwood, MD  Encounter Date: 12/13/2014      PT End of Session - 12/13/14 1650    Visit Number 4   Number of Visits 17   Date for PT Re-Evaluation 01/31/15   Authorization Type [employer]   PT Start Time 0102   PT Stop Time 1622   PT Time Calculation (min) 51 min   Equipment Utilized During Treatment Gait belt   Activity Tolerance Patient tolerated treatment well   Behavior During Therapy [facility] for tasks assessed/performed      Past Medical History  Diagnosis Date  . Allergy     seasonal  . Depression   . Neuromuscular disorder     MS  . Thyroid disease     hypothyroid  . Multiple sclerosis     Past Surgical History  Procedure Laterality Date  . Cesarean section      x2  . Laparoscopic abdominal exploration      for endometriosis    There were no vitals filed for this visit.  Visit Diagnosis:  Abnormality of gait  Generalized weakness  Abnormal posture  Unsteadiness      Subjective Assessment - 12/13/14 1542    Subjective Denies falls. Reports pain in lower back, which pt attributes to having lost balance ("left ankle collapsed") when pt was attempting to use step to get into high bed at home. No pain in ankle.   Pertinent History Secondary progressive multiple sclerosis (MS diagnosed in 1988), hypothyroidism, depression   Limitations Walking;House hold activities   Patient Stated Goals "Anything that can help me walk easier and not get that leaning to one side."   Currently in Pain? Yes   Pain Score 7    Pain Location Back   Pain Orientation Right;Left;Lower   Pain Descriptors / Indicators Aching   Pain Type Acute pain   Aggravating Factors  When I'm not  in the position when I'm doing the exercises   Pain Relieving Factors Resting      Treatment   Brief assessment of lower back pain reveals the following: increased concordant pain with lumbar spine extension, B thoracolumbar lateral flexion; no change in pain with thoracolumbar flexion, B rotation. Increased hypermobility and reproduction of concordant pain with P/A of L1-2. Ruled out SI joint dysfunction via (-) SIJD Clinical Prediction Guideline.  Neuro Re-ed: - Pt performed 1 set of all home exercises to ensure safe/proper technique. Min cueing provided to ensure safe/proper technique with effective within-session carryover. - See NMR section below for further detail.                    [facility] Adult PT Treatment/Exercise - 12/13/14 0001    Bed Mobility   Supine to Sit 5: Supervision   Supine to Sit Details (indicate cue type and reason) cueing to avoid excessive thoracolumbar rotation   Sit to Supine 5: Supervision   Sit to Supine - Details (indicate cue type and reason) see above   Transfers   Transfers Sit to Stand;Stand to Sit   Sit to Stand 6: Modified independent (Device/Increase time)   Stand to Sit 6: Modified independent (Device/Increase time)   Ambulation/Gait   Ambulation/Gait Yes   Ambulation/Gait Assistance 5: Supervision;4: Min guard   Ambulation/Gait  Assistance Details single L toe catch with effective self-recovery; cueing focused on wider BOS, upright posture   Ambulation Distance (Feet) 345 Feet   Assistive device None   Gait Pattern Step-through pattern;Decreased dorsiflexion - left;Lateral hip instability;Trendelenburg;Lateral trunk lean to right;Trunk rotated posteriorly on left;Decreased hip/knee flexion - right;Decreased hip/knee flexion - left;Trunk flexed;Poor foot clearance - right;Narrow base of support;Scissoring  L Trendelenburg; B hips internally rotated   Ambulation Surface Level;Indoor   Gait Comments intermittent R toe catch occurred  secondary to L Trendelenberg (R hip drop) causing RLE adduction/scissoring   Posture/Postural Control   Posture/Postural Control Postural limitations   Postural Limitations Rounded Shoulders;Increased thoracic kyphosis   Posture Comments L lateral flexion of cervical spine; lateral trunk shift to R side; bears most of weight on R hip   Neuro Re-ed    Neuro Re-ed Details  Standing 4" from wall, pt performed wall bumps to train hip strategy with posterior LOB; pt attempted for >5 minutes consecutively with much difficulty, cueing required prior to being able to effectively perform. Transitioned to static standing on foam beam without UE support to elicit hip strategy; min A required to prevent posterior LOB                PT Education - 12/13/14 1704    Education provided Yes   Education Details Fall prevention strategies. HEP: reviewed; added wall bumps.   Person(s) Educated Patient   Methods Explanation;Demonstration;Verbal cues;Handout   Comprehension Verbalized understanding;Returned demonstration          PT Short Term Goals - 12/13/14 1657    PT SHORT TERM GOAL #1   Title Pt will perform home exercises with mod I using paper handout to indicate safe daily compliance with HEP. Target date: 12/30/14.   Baseline Met 12/13/14.   Status Achieved   PT SHORT TERM GOAL #2   Title Pt will demonstrate ability to self-correct posture with subtle cueing to indicate improved postural awareness. Target date: 12/30/14.   Status On-going   PT SHORT TERM GOAL #3   Title Pt will increase Dynamic Gait Index score from 13/24 to 17/24 to indicate increased gait stability in presence of external demands. Target date: 12/30/14.   Status On-going   PT SHORT TERM GOAL #4   Title Pt will negotiate 2 stairs with single L rail and mod I, increased time to enable pt to use all home entrances. Target date: 12/30/14.   Status On-going           PT Long Term Goals - 12/07/14 1613    PT LONG TERM  GOAL #1   Title Pt will increase self-selected gait speed from 2.94 ft/sec to 3.74 ft/sec to indicate increased efficiency of ambulation. Target date: 01/27/15.   Status On-going   PT LONG TERM GOAL #2   Title Pt will increase Dynamic Gait Index score from 13 to 20/24 to indicate decreased risk for falls. Target date: 01/27/15.   Status On-going   PT LONG TERM GOAL #3   Title Pt will ambulate 500' over unlevel, outdoor surfaces with mod I using LRAD to indicate increased stability/independence with community mobility. Target date: 01/27/15.   Status On-going   PT LONG TERM GOAL #4   Title Pt will independently negotiate curb step and outdoor inclined/declined surfaces with no overt LOB to indicate safety traversing community obstacles. Target date: 01/27/15.   Status On-going   PT LONG TERM GOAL #5   Title Pt will increase Neuro QoL Lower Extremity  score >/= 10 points from baseline to indicate improved quality of life related to LE function. Target date: 01/27/15.   Baseline NeuroQOL LE: 34.3   Status On-going               Plan - 12/13/14 1651    Clinical Impression Statement Increased lumbar spine pain appeared to be secondary to lumbar instability, decreased core muscular activation during transitional movements. With min cueing for body mechanics/technique with HEP performance, pt reporting no pain. Pt met STG 1 for HEP performance. Noted ineffective hip strategy with posterior balance perturbations/LOB. Continue per POC.   Pt will benefit from skilled therapeutic intervention in order to improve on the following deficits Abnormal gait;Decreased activity tolerance;Decreased balance;Decreased coordination;Decreased strength;Impaired perceived functional ability;Improper body mechanics;Postural dysfunction;Decreased mobility;Decreased cognition   Rehab Potential Excellent   PT Frequency 2x / week   PT Duration 8 weeks   PT Treatment/Interventions ADLs/Self Care Home  Management;Vestibular;Functional mobility training;Stair training;Gait training;Therapeutic activities;Therapeutic exercise;Balance training;Neuromuscular re-education;Patient/family education;Orthotic Fit/Training;Passive range of motion;Manual techniques;Energy conservation   PT Next Visit Plan Orthotist to be present for L AFO consult. Train hip strategy. Increase pt awareness of postural alignment   PT Home Exercise Plan See Pt Instructions on 7/25 for full HEP.   Recommended Other Services Orthotist, Gerald Stabs, planned to be present at next PT session to assess pt appropriateness for L AFO, possible leather toe cap on L shoe. Let Gerald Stabs know that pt needs to know financial obligations prior to committing to getting brace.   Consulted and Agree with Plan of Care Patient        Billie Ruddy, PT, DPT [facility] [address] [place], [place], [address] Phone: [phone]   Fax:  [phone] 12/13/2014, 5:09 PM

## 2014-12-15 ENCOUNTER — Ambulatory Visit: Payer: 59 | Admitting: Physical Therapy

## 2014-12-17 ENCOUNTER — Ambulatory Visit: Payer: 59 | Admitting: Physical Therapy

## 2014-12-17 DIAGNOSIS — R531 Weakness: Secondary | ICD-10-CM

## 2014-12-17 DIAGNOSIS — M21372 Foot drop, left foot: Secondary | ICD-10-CM

## 2014-12-17 DIAGNOSIS — R269 Unspecified abnormalities of gait and mobility: Secondary | ICD-10-CM | POA: Diagnosis not present

## 2014-12-17 NOTE — Therapy (Signed)
[facility] [address] [place], [place], [address] Phone: [phone]   Fax:  [phone]  Physical Therapy Treatment  Patient Details  Name: Harryette Shuart MRN: [id number] Date of Birth: 09-16-1955 Referring Provider:  Marton Redwood, MD  Encounter Date: 12/17/2014      PT End of Session - 12/17/14 1456    Visit Number 5   Number of Visits 17   Date for PT Re-Evaluation 01/31/15   Authorization Type [employer]   PT Start Time 1402   PT Stop Time 1747   PT Time Calculation (min) 45 min   Equipment Utilized During Treatment Gait belt   Activity Tolerance Patient tolerated treatment well   Behavior During Therapy [facility] for tasks assessed/performed      Past Medical History  Diagnosis Date  . Allergy     seasonal  . Depression   . Neuromuscular disorder     MS  . Thyroid disease     hypothyroid  . Multiple sclerosis     Past Surgical History  Procedure Laterality Date  . Cesarean section      x2  . Laparoscopic abdominal exploration      for endometriosis    There were no vitals filed for this visit.  Visit Diagnosis:  Abnormality of gait  Generalized weakness  Foot drop, left      Subjective Assessment - 12/17/14 1406    Subjective Pt denies falls. No lower back pain. Pt reporting on medial aspect of L knee, stating,"It only hurts when I do the clam(shell) exercise on both legs."   Pertinent History Secondary progressive multiple sclerosis (MS diagnosed in 1988), hypothyroidism, depression   Limitations Walking;House hold activities   Patient Stated Goals "Anything that can help me walk easier and not get that leaning to one side."   Currently in Pain? No/denies                         [facility] Adult PT Treatment/Exercise - 12/17/14 1453    Transfers   Transfers Sit to Stand;Stand to Sit   Sit to Stand 6: Modified independent (Device/Increase time)   Stand to Sit 6:  Modified independent (Device/Increase time)   Ambulation/Gait   Ambulation/Gait Yes   Ambulation/Gait Assistance 5: Supervision;4: Min guard  wearing L PLS AFO and heel wedge in L shoe   Ambulation Distance (Feet) 1150 Feet   Gait Pattern Step-through pattern;Decreased dorsiflexion - left;Lateral hip instability;Trendelenburg;Lateral trunk lean to right;Trunk rotated posteriorly on left;Decreased hip/knee flexion - right;Decreased hip/knee flexion - left;Trunk flexed;Poor foot clearance - right;Narrow base of support;Scissoring  L Trendelenburg; B hips internally rotated   Ambulation Surface Level;Indoor                PT Education - 12/17/14 1506    Education provided Yes   Education Details Recommendation of L posterior leaf spring AFO to address L foot drop and L genu recurvatum. Discussed resources [facility] [employer]) for financial assistance with L AFO, if needed. Recommending SPC for all mobility.   Person(s) Educated Patient   Methods Explanation   Comprehension Verbalized understanding          PT Short Term Goals - 12/13/14 1657    PT SHORT TERM GOAL #1   Title Pt will perform home exercises with mod I using paper handout to indicate safe daily compliance with HEP. Target date: 12/30/14.   Baseline Met 12/13/14.   Status Achieved  PT SHORT TERM GOAL #2   Title Pt will demonstrate ability to self-correct posture with subtle cueing to indicate improved postural awareness. Target date: 12/30/14.   Status On-going   PT SHORT TERM GOAL #3   Title Pt will increase Dynamic Gait Index score from 13/24 to 17/24 to indicate increased gait stability in presence of external demands. Target date: 12/30/14.   Status On-going   PT SHORT TERM GOAL #4   Title Pt will negotiate 2 stairs with single L rail and mod I, increased time to enable pt to use all home entrances. Target date: 12/30/14.   Status On-going           PT Long Term Goals - 12/07/14 1613    PT LONG TERM GOAL  #1   Title Pt will increase self-selected gait speed from 2.94 ft/sec to 3.74 ft/sec to indicate increased efficiency of ambulation. Target date: 01/27/15.   Status On-going   PT LONG TERM GOAL #2   Title Pt will increase Dynamic Gait Index score from 13 to 20/24 to indicate decreased risk for falls. Target date: 01/27/15.   Status On-going   PT LONG TERM GOAL #3   Title Pt will ambulate 500' over unlevel, outdoor surfaces with mod I using LRAD to indicate increased stability/independence with community mobility. Target date: 01/27/15.   Status On-going   PT LONG TERM GOAL #4   Title Pt will independently negotiate curb step and outdoor inclined/declined surfaces with no overt LOB to indicate safety traversing community obstacles. Target date: 01/27/15.   Status On-going   PT LONG TERM GOAL #5   Title Pt will increase Neuro QoL Lower Extremity score >/= 10 points from baseline to indicate improved quality of life related to LE function. Target date: 01/27/15.   Baseline NeuroQOL LE: 34.3   Status On-going               Plan - 12/17/14 1457    Clinical Impression Statement Orthotist present for this session to assess/address if pt appropriate candidate for L ankle-foot orthotic. While wearing L PLS AFO ([place] WalkOn) and heel wedge in L shoe, pt demonstrated improved L foot clearance during LLE advancement (no episodes of L toe catch), no significant L genu recurvatum, and no overt LOB during ambulation x1,150' consecutively without AD. Moreover, light weight of carbon fiber brace is most ideal for energy conservation, L hip weakness. Per interactive discussion with pt, orthotist, and this PT, all in agreement that L PLS AFO is best option to maximize pt safety with functional mobility/ambulation, increase energy conservation, and prevent future falls. Continue per POC.   Pt will benefit from skilled therapeutic intervention in order to improve on the following deficits Abnormal gait;Decreased  activity tolerance;Decreased balance;Decreased coordination;Decreased strength;Impaired perceived functional ability;Improper body mechanics;Postural dysfunction;Decreased mobility;Decreased cognition   Rehab Potential Excellent   PT Frequency 2x / week   PT Duration 8 weeks   PT Treatment/Interventions ADLs/Self Care Home Management;Vestibular;Functional mobility training;Stair training;Gait training;Therapeutic activities;Therapeutic exercise;Balance training;Neuromuscular re-education;Patient/family education;Orthotic Fit/Training;Passive range of motion;Manual techniques;Energy conservation   PT Next Visit Plan Continue to address postural/gait deviations. Gait training with L PLS AFO (in cabinet above Brigett Estell's desk) during PT. Continue to recommend [facility] for all mobility.   PT Home Exercise Plan See Pt Instructions on 7/25 for full HEP.   Recommended Other Services Orthotist, Marcello Moores, will contact paitent wilth financial responsbility for L AFO.   Consulted and Agree with Plan of Care Patient  Problem List There are no active problems to display for this patient.   Billie Ruddy, PT, DPT [facility] [address] [place], [place], [address] Phone: [phone]   Fax:  [phone] 12/17/2014, 3:14 PM

## 2014-12-20 ENCOUNTER — Encounter: Payer: Self-pay | Admitting: Physical Therapy

## 2014-12-20 ENCOUNTER — Ambulatory Visit: Payer: 59 | Attending: Internal Medicine | Admitting: Physical Therapy

## 2014-12-20 DIAGNOSIS — R2681 Unsteadiness on feet: Secondary | ICD-10-CM | POA: Diagnosis present

## 2014-12-20 DIAGNOSIS — R293 Abnormal posture: Secondary | ICD-10-CM | POA: Insufficient documentation

## 2014-12-20 DIAGNOSIS — R531 Weakness: Secondary | ICD-10-CM | POA: Diagnosis present

## 2014-12-20 DIAGNOSIS — R269 Unspecified abnormalities of gait and mobility: Secondary | ICD-10-CM | POA: Diagnosis not present

## 2014-12-20 DIAGNOSIS — M21372 Foot drop, left foot: Secondary | ICD-10-CM | POA: Insufficient documentation

## 2014-12-20 NOTE — Therapy (Signed)
[facility] [address] [place], [place], [address] Phone: [phone]   Fax:  [phone]  Physical Therapy Treatment  Patient Details  Name: Amanda Li MRN: [id number] Date of Birth: [birth year] Referring Provider:  Marton Redwood, MD  Encounter Date: 12/20/2014      PT End of Session - 12/20/14 0936    Visit Number 6   Number of Visits 17   Date for PT Re-Evaluation 01/31/15   Authorization Type [employer]   PT Start Time [phone]   PT Stop Time 1015   PT Time Calculation (min) 41 min   Equipment Utilized During Treatment Gait belt   Activity Tolerance Patient tolerated treatment well   Behavior During Therapy [facility] for tasks assessed/performed      Past Medical History  Diagnosis Date  . Allergy     seasonal  . Depression   . Neuromuscular disorder     MS  . Thyroid disease     hypothyroid  . Multiple sclerosis     Past Surgical History  Procedure Laterality Date  . Cesarean section      x2  . Laparoscopic abdominal exploration      for endometriosis    There were no vitals filed for this visit.  Visit Diagnosis:  Abnormality of gait  Generalized weakness  Foot drop, left  Abnormal posture  Unsteadiness      Subjective Assessment - 12/20/14 0934    Subjective Had one fall over the weekend. She was going to bathroom at [facility] and the door was suck. When she went to push it open she fell forward into bathroom. She scraped her knee and one finger. Other wise no other injury. No pain.   Currently in Pain? No/denies          [facility] Adult PT Treatment/Exercise - 12/20/14 0937    Transfers   Sit to Stand 6: Modified independent (Device/Increase time)   Stand to Sit 6: Modified independent (Device/Increase time)   Ambulation/Gait   Ambulation/Gait Yes   Ambulation/Gait Assistance 4: Min guard   Ambulation/Gait Assistance Details pt with occasional left toe scuffing despite  having brace on. this improved if pt maintained a wider base of support with larger step length. minmal cues for this. increased assist for balalnce on outdoor complaint surfaces as well.                        Ambulation Distance (Feet) 345 Feet  x1 indoors; 330 outdoors   Assistive device Straight cane   Gait Pattern Step-through pattern;Decreased dorsiflexion - left;Lateral hip instability;Trendelenburg;Lateral trunk lean to right;Trunk rotated posteriorly on left;Decreased hip/knee flexion - right;Decreased hip/knee flexion - left;Trunk flexed;Poor foot clearance - right;Narrow base of support;Scissoring   Ambulation Surface Level;Indoor;Unlevel;Outdoor;Paved;Gravel;Grass   Ramp 4: Min assist  to min guard assist;x 2 with cane/brace   Ramp Details (indicate cue type and reason) cues on posture and technique   Curb 4: Min assist  to min guard assist;x 2 with cane/brace   Curb Details (indicate cue type and reason) cues on sequence/cane placement      Exercises  hook lying: - bridge 5 sec hold x 10 reps - straight leg raises 3 sec holds x 10 each leg Side lying Clam shell x 10 each side Hip abduction x 10 each side Scifit x 4 extremities level 1.5 x 5 minutes with goal >/=45 rpm for strengthening and activity tolerance.  PT Short Term Goals - 12/13/14 1657    PT SHORT TERM GOAL #1   Title Pt will perform home exercises with mod I using paper handout to indicate safe daily compliance with HEP. Target date: 12/30/14.   Baseline Met 12/13/14.   Status Achieved   PT SHORT TERM GOAL #2   Title Pt will demonstrate ability to self-correct posture with subtle cueing to indicate improved postural awareness. Target date: 12/30/14.   Status On-going   PT SHORT TERM GOAL #3   Title Pt will increase Dynamic Gait Index score from 13/24 to 17/24 to indicate increased gait stability in presence of external demands. Target date: 12/30/14.   Status On-going   PT SHORT TERM GOAL #4   Title  Pt will negotiate 2 stairs with single L rail and mod I, increased time to enable pt to use all home entrances. Target date: 12/30/14.   Status On-going           PT Long Term Goals - 12/07/14 1613    PT LONG TERM GOAL #1   Title Pt will increase self-selected gait speed from 2.94 ft/sec to 3.74 ft/sec to indicate increased efficiency of ambulation. Target date: 01/27/15.   Status On-going   PT LONG TERM GOAL #2   Title Pt will increase Dynamic Gait Index score from 13 to 20/24 to indicate decreased risk for falls. Target date: 01/27/15.   Status On-going   PT LONG TERM GOAL #3   Title Pt will ambulate 500' over unlevel, outdoor surfaces with mod I using LRAD to indicate increased stability/independence with community mobility. Target date: 01/27/15.   Status On-going   PT LONG TERM GOAL #4   Title Pt will independently negotiate curb step and outdoor inclined/declined surfaces with no overt LOB to indicate safety traversing community obstacles. Target date: 01/27/15.   Status On-going   PT LONG TERM GOAL #5   Title Pt will increase Neuro QoL Lower Extremity score >/= 10 points from baseline to indicate improved quality of life related to LE function. Target date: 01/27/15.   Baseline NeuroQOL LE: 34.3   Status On-going            Plan - 12/20/14 0936    Clinical Impression Statement Continued with use of the brace with heel wedge to left foot on indoor level/unlevel and outdoor unlevel/compliant surfaces with up to min assist needed for balance and cues to correct gait deviations. No issues reported with exercises or gait today. Pt making steady progress toward goals.                                                          Pt will benefit from skilled therapeutic intervention in order to improve on the following deficits Abnormal gait;Decreased activity tolerance;Decreased balance;Decreased coordination;Decreased strength;Impaired perceived functional ability;Improper body mechanics;Postural  dysfunction;Decreased mobility;Decreased cognition   Rehab Potential Excellent   PT Frequency 2x / week   PT Duration 8 weeks   PT Treatment/Interventions ADLs/Self Care Home Management;Vestibular;Functional mobility training;Stair training;Gait training;Therapeutic activities;Therapeutic exercise;Balance training;Neuromuscular re-education;Patient/family education;Orthotic Fit/Training;Passive range of motion;Manual techniques;Energy conservation   PT Next Visit Plan Continue to address postural/gait deviations. Gait training with L PLS AFO (in cabinet above Blair's desk) during PT. Continue to recommend [facility] for all mobility.   PT Home Exercise Plan See  Pt Instructions on 7/25 for full HEP.   Consulted and Agree with Plan of Care Patient        Problem List There are no active problems to display for this patient.   Willow Ora 12/20/2014, 1:51 PM  Willow Ora, PTA, [facility] [address] [place],  [address] [phone] 12/20/2014, 1:51 PM

## 2014-12-22 ENCOUNTER — Ambulatory Visit: Payer: Self-pay | Admitting: Physical Therapy

## 2014-12-23 ENCOUNTER — Ambulatory Visit: Payer: 59 | Admitting: Physical Therapy

## 2014-12-27 ENCOUNTER — Ambulatory Visit: Payer: 59 | Admitting: Physical Therapy

## 2014-12-27 DIAGNOSIS — M21372 Foot drop, left foot: Secondary | ICD-10-CM

## 2014-12-27 DIAGNOSIS — R531 Weakness: Secondary | ICD-10-CM

## 2014-12-27 DIAGNOSIS — R269 Unspecified abnormalities of gait and mobility: Secondary | ICD-10-CM | POA: Diagnosis not present

## 2014-12-27 NOTE — Patient Instructions (Signed)
Achilles / Gastroc, Standing   Stand, right foot behind, heel on floor with back knee straight, forward knee bent. Move hips forward. You should feel a gentle stretch in your calf. Hold _30__ seconds. Switch legs and perform stretch on opposite leg. Repeat _3__times per day.  Then, perform the same stretch but bent your back knee very slightly. You should feel a deep stretch in your calf. Do this 3 times per day on each leg.   ANKLE: Dorsiflexion (Band)   Sit at edge of surface. Place YELLOW band around top of foot. Keeping heel on floor, raise toes of banded foot. Hold _2__ seconds.  15 reps per set. Do this 3 times on each side every day.  ANKLE: Eversion, Unilateral (Band)   Place band around left foot. Keeping heel in place, raise toes of banded foot up and away from body. Do not move hip.  Do this 15 times, 3 sets per day.    ANKLE: Inversion, Unilateral (Band)   Placing band around left foot. Keeping heel in place, lift toes of banded foot up and in. Do not move hip. Do this 15 times, 3 sets per day.  Copyright  [employer]. All rights reserved.

## 2014-12-27 NOTE — Therapy (Signed)
[facility] [address] [place], [place], [address] Phone: [phone]   Fax:  [phone]  Physical Therapy Treatment  Patient Details  Name: Amanda Li MRN: [id number] Date of Birth: 1955-08-30 Referring Provider:  Marton Redwood, MD  Encounter Date: 12/27/2014      PT End of Session - 12/27/14 1939    Visit Number 7   Number of Visits 17   Date for PT Re-Evaluation 01/31/15   Authorization Type [employer]   PT Start Time 7169   PT Stop Time 1448   PT Time Calculation (min) 60 min   Equipment Utilized During Treatment Gait belt   Activity Tolerance Patient tolerated treatment well   Behavior During Therapy [facility] for tasks assessed/performed      Past Medical History  Diagnosis Date  . Allergy     seasonal  . Depression   . Neuromuscular disorder     MS  . Thyroid disease     hypothyroid  . Multiple sclerosis     Past Surgical History  Procedure Laterality Date  . Cesarean section      x2  . Laparoscopic abdominal exploration      for endometriosis    There were no vitals filed for this visit.  Visit Diagnosis:  Abnormality of gait  Generalized weakness  Foot drop, left      Subjective Assessment - 12/27/14 1408    Subjective Pt reporting that L AFO ended up being expensive than she anticipated. Also reports ongoing intermittent pain in B ankles, which was diagnosed as B posterior tibial tendonitis 2 years ago. Pt states, "It's hard to tell what brings it (ankle pain) on, but it's enough to limit my activity."   Pertinent History Secondary progressive multiple sclerosis (MS diagnosed in 1988), hypothyroidism, depression   Limitations Walking;House hold activities   Patient Stated Goals "Anything that can help me walk easier and not get that leaning to one side."   Currently in Pain? Yes   Pain Score 2    Pain Location Ankle   Pain Orientation Left   Pain Descriptors / Indicators  Aching   Pain Type Chronic pain   Pain Onset More than a month ago  "on and off" over past 2 years    Pain Frequency Intermittent   Aggravating Factors  "Not sure; nothing really seems to cause it."   Pain Relieving Factors "Aleve seems to help"                         [facility] Adult PT Treatment/Exercise - 12/27/14 0001    Transfers   Sit to Stand 6: Modified independent (Device/Increase time)   Stand to Sit 6: Modified independent (Device/Increase time)   Ambulation/Gait   Ambulation/Gait Yes   Ambulation/Gait Assistance 4: Min guard   Ambulation/Gait Assistance Details using L AFO (PLS); cueing focused on decreasing gait velocity, widening BOS, increasing L hip/knee flexion during LLE advancement, L heel strike.   Ambulation Distance (Feet) 500 Feet  x1 indoors; 330 outdoors   Assistive device Straight cane   Gait Pattern Step-through pattern;Decreased dorsiflexion - left;Lateral hip instability;Trendelenburg;Lateral trunk lean to right;Trunk rotated posteriorly on left;Decreased hip/knee flexion - left;Trunk flexed;Narrow base of support;Scissoring;Poor foot clearance - left;Left foot flat   Ambulation Surface Level;Indoor   Exercises   Exercises Other Exercises   Other Exercises  PT instructed pt in home exercises to address B ankle pain. Pt performed said exerciss on LLE  only during this session. Provided demonstration and verbal cueing throughout for proper technique. See Pt Instructions for details on each exercise, reps/sets, frequency/duration.                PT Education - 12/27/14 1931    Education provided Yes   Education Details HEP to address bilat ankle pain.   Person(s) Educated Patient   Methods Explanation;Demonstration;Verbal cues;Handout   Comprehension Verbalized understanding;Returned demonstration          PT Short Term Goals - 12/13/14 1657    PT SHORT TERM GOAL #1   Title Pt will perform home exercises with mod I using paper  handout to indicate safe daily compliance with HEP. Target date: 12/30/14.   Baseline Met 12/13/14.   Status Achieved   PT SHORT TERM GOAL #2   Title Pt will demonstrate ability to self-correct posture with subtle cueing to indicate improved postural awareness. Target date: 12/30/14.   Status On-going   PT SHORT TERM GOAL #3   Title Pt will increase Dynamic Gait Index score from 13/24 to 17/24 to indicate increased gait stability in presence of external demands. Target date: 12/30/14.   Status On-going   PT SHORT TERM GOAL #4   Title Pt will negotiate 2 stairs with single L rail and mod I, increased time to enable pt to use all home entrances. Target date: 12/30/14.   Status On-going           PT Long Term Goals - 12/07/14 1613    PT LONG TERM GOAL #1   Title Pt will increase self-selected gait speed from 2.94 ft/sec to 3.74 ft/sec to indicate increased efficiency of ambulation. Target date: 01/27/15.   Status On-going   PT LONG TERM GOAL #2   Title Pt will increase Dynamic Gait Index score from 13 to 20/24 to indicate decreased risk for falls. Target date: 01/27/15.   Status On-going   PT LONG TERM GOAL #3   Title Pt will ambulate 500' over unlevel, outdoor surfaces with mod I using LRAD to indicate increased stability/independence with community mobility. Target date: 01/27/15.   Status On-going   PT LONG TERM GOAL #4   Title Pt will independently negotiate curb step and outdoor inclined/declined surfaces with no overt LOB to indicate safety traversing community obstacles. Target date: 01/27/15.   Status On-going   PT LONG TERM GOAL #5   Title Pt will increase Neuro QoL Lower Extremity score >/= 10 points from baseline to indicate improved quality of life related to LE function. Target date: 01/27/15.   Baseline NeuroQOL LE: 34.3   Status On-going               Plan - 12/27/14 1940    Clinical Impression Statement Focused on HEP to address ongoing B ankle pain. Pt exhibits effective  withins-session carryover of cueing to address gait deviations. Will plan to continue to reinforce need for increased L hip/knee flexion during LLE advancement. Continue per POC.   Pt will benefit from skilled therapeutic intervention in order to improve on the following deficits Abnormal gait;Decreased activity tolerance;Decreased balance;Decreased coordination;Decreased strength;Impaired perceived functional ability;Improper body mechanics;Postural dysfunction;Decreased mobility;Decreased cognition   Rehab Potential Excellent   PT Frequency 2x / week   PT Duration 8 weeks   PT Treatment/Interventions ADLs/Self Care Home Management;Vestibular;Functional mobility training;Stair training;Gait training;Therapeutic activities;Therapeutic exercise;Balance training;Neuromuscular re-education;Patient/family education;Orthotic Fit/Training;Passive range of motion;Manual techniques;Energy conservation   PT Next Visit Plan Check STG's. Ask about HEP for B ankle pain.  Continue to address postural/gait deviations. Gait training with L PLS AFO (in cabinet above Keeshia Sanderlin's desk) during PT. Continue to recommend [facility] for all mobility.   PT Home Exercise Plan See Pt Instructions on 7/25 for full HEP.   Recommended Other Services Research available financial assistance from [employer] to enable pt to obtain L AFO.   Consulted and Agree with Plan of Care Patient        Problem List There are no active problems to display for this patient.   Billie Ruddy, PT, DPT [facility] [address] [place], [place], [address] Phone: [phone]   Fax:  [phone] 12/27/2014, 7:47 PM

## 2014-12-28 ENCOUNTER — Other Ambulatory Visit: Payer: Self-pay | Admitting: Psychiatry

## 2014-12-28 DIAGNOSIS — G35D Multiple sclerosis, unspecified: Secondary | ICD-10-CM

## 2014-12-28 DIAGNOSIS — G35 Multiple sclerosis: Secondary | ICD-10-CM

## 2014-12-29 ENCOUNTER — Ambulatory Visit: Payer: Self-pay | Admitting: Physical Therapy

## 2014-12-30 ENCOUNTER — Ambulatory Visit: Payer: 59 | Admitting: Physical Therapy

## 2014-12-30 DIAGNOSIS — M21372 Foot drop, left foot: Secondary | ICD-10-CM

## 2014-12-30 DIAGNOSIS — R531 Weakness: Secondary | ICD-10-CM

## 2014-12-30 DIAGNOSIS — R2681 Unsteadiness on feet: Secondary | ICD-10-CM

## 2014-12-30 DIAGNOSIS — R269 Unspecified abnormalities of gait and mobility: Secondary | ICD-10-CM

## 2014-12-30 NOTE — Patient Instructions (Addendum)
Achilles / Gastroc, Standing   Stand, right foot behind, heel on floor with back knee straight, forward knee bent. Move hips forward. You should feel a gentle stretch in your calf. Hold _30__ seconds. Switch legs and perform stretch on opposite leg. Repeat _3__times per day.  Then, perform the same stretch but bent your back knee very slightly. You should feel a deep stretch in your calf. Do this 3 times per day on each leg.   ANKLE: Dorsiflexion (Band)   Sit at edge of surface. Place band around top of foot. Keeping heel on floor, raise toes of banded foot. Hold _2__ seconds.  15 reps per set. Do this 3 times on each side every day.  ANKLE: Eversion, Unilateral (Band)   Place band around left foot. Keeping heel in place, raise toes of banded foot up and away from body. Do not move hip. Do this 15 times, then do 15 reps on the right foot. Do 3 sets per day on each foot.   ANKLE: Inversion, Unilateral (Band)   Placing band around left foot. Keeping heel in place, lift toes of banded foot up and in. Do not move hip. Do this 15 times, then perform 15 reps on right foot. Perform 3 sets per day.

## 2014-12-30 NOTE — Therapy (Signed)
[facility] [address] [place], [place], [address] Phone: [phone]   Fax:  [phone]  Physical Therapy Treatment  Patient Details  Name: Amanda Li MRN: [id number] Date of Birth: 21-Oct-1955 Referring Provider:  Marton Redwood, MD  Encounter Date: 12/30/2014      PT End of Session - 12/30/14 2117    Visit Number 8   Number of Visits 17   Date for PT Re-Evaluation 01/31/15   Authorization Type [employer]   PT Start Time 1406   PT Stop Time 1445   PT Time Calculation (min) 39 min   Equipment Utilized During Treatment Gait belt   Activity Tolerance Patient tolerated treatment well   Behavior During Therapy [facility] for tasks assessed/performed      Past Medical History  Diagnosis Date  . Allergy     seasonal  . Depression   . Neuromuscular disorder     MS  . Thyroid disease     hypothyroid  . Multiple sclerosis     Past Surgical History  Procedure Laterality Date  . Cesarean section      x2  . Laparoscopic abdominal exploration      for endometriosis    There were no vitals filed for this visit.  Visit Diagnosis:  Abnormality of gait  Generalized weakness  Foot drop, left  Unsteadiness      Subjective Assessment - 12/30/14 1415    Subjective Pt reporting increased pain in R ankle today. She had stopped taing Aleve; then pain increased in R ankle, so she began taking Aleve again this morning and pain improved. Pt states, "I've been thinking about walking with my feet further apart." Denies falls.   Patient is accompained by: Family member   Pertinent History Secondary progressive multiple sclerosis (MS diagnosed in 1988), hypothyroidism, depression   Limitations Walking;House hold activities   Patient Stated Goals "Anything that can help me walk easier and not get that leaning to one side."   Currently in Pain? Yes   Pain Score 4    Pain Location Ankle   Pain Orientation Right   Pain Descriptors / Indicators Aching   Pain Type Chronic pain   Pain Onset More than a month ago   Pain Frequency Intermittent   Aggravating Factors  "Nothing really seems to cause it."   Pain Relieving Factors "Aleve seems to help"   Effect of Pain on Daily Activities "When it's bad, it tends to really limit what I do."            [facility] PT Assessment - 12/30/14 0001    Dynamic Gait Index   Level Surface Mild Impairment   Change in Gait Speed Mild Impairment   Gait with Horizontal Head Turns Mild Impairment   Gait with Vertical Head Turns Mild Impairment   Gait and Pivot Turn Normal   Step Over Obstacle Mild Impairment   Step Around Obstacles Normal   Steps Mild Impairment   Total Score 18                     [facility] Adult PT Treatment/Exercise - 12/30/14 0001    Transfers   Sit to Stand 6: Modified independent (Device/Increase time)   Stand to Sit 6: Modified independent (Device/Increase time)   Ambulation/Gait   Ambulation/Gait Yes   Ambulation/Gait Assistance 4: Min guard;4: Min assist   Ambulation Distance (Feet) 315 Feet  x1 indoors; 330 outdoors   Assistive device Straight cane  L AFO   Gait Pattern Step-through pattern;Decreased dorsiflexion - left;Lateral hip instability;Trendelenburg;Lateral trunk lean to right;Trunk rotated posteriorly on left;Decreased hip/knee flexion - left;Trunk flexed;Poor foot clearance - left;Left foot flat   Ambulation Surface Level;Indoor   Stairs Yes   Stairs Assistance 5: Supervision;4: Min guard   Stairs Assistance Details (indicate cue type and reason) cueing for step-to pattern leading with RLE to ascend, LLE to descend.   Stair Management Technique One rail Left;Step to pattern;Forwards   Number of Stairs 16  performed 4 stairs x4 consecutive trials   Height of Stairs 6   Gait Comments Pt required blocked practice of stair negotiation for carryover of sequencing, technique   Exercises   Exercises Other Exercises    Other Exercises  Pt performed home exercises for ankle pain with min verbal, demonstration cueing for proper technique. See Pt Instructions for details on each exercise, reps/sets, frequency/duration. Modified Theraband-resisted ankle strengthening from yellow to green Tband.                PT Education - 12/30/14 2116    Education provided Yes   Education Details Educated pt on DGI findings, functional implications, STG progress. Reviewed HEP for B ankle pain.    Person(s) Educated Patient   Methods Explanation;Demonstration;Verbal cues   Comprehension Verbalized understanding;Returned demonstration          PT Short Term Goals - 12/30/14 1432    PT SHORT TERM GOAL #1   Title Pt will perform home exercises with mod I using paper handout to indicate safe daily compliance with HEP. Target date: 12/30/14.   Baseline Met 12/13/14.   Status Achieved   PT SHORT TERM GOAL #2   Title Pt will demonstrate ability to self-correct posture with subtle cueing to indicate improved postural awareness. Target date: 12/30/14.   Status Achieved   PT SHORT TERM GOAL #3   Title Pt will increase Dynamic Gait Index score from 13/24 to 17/24 to indicate increased gait stability in presence of external demands. Target date: 12/30/14.   Baseline 12/30/14: DGI score = 18/124   Status Achieved   PT SHORT TERM GOAL #4   Title Pt will negotiate 2 stairs with single L rail and mod I, increased time to enable pt to use all home entrances. Target date: 12/30/14.   Baseline Requires supervision, cueing for safe technique, sequencing.   Status Not Met           PT Long Term Goals - 12/07/14 1613    PT LONG TERM GOAL #1   Title Pt will increase self-selected gait speed from 2.94 ft/sec to 3.74 ft/sec to indicate increased efficiency of ambulation. Target date: 01/27/15.   Status On-going   PT LONG TERM GOAL #2   Title Pt will increase Dynamic Gait Index score from 13 to 20/24 to indicate decreased risk for  falls. Target date: 01/27/15.   Status On-going   PT LONG TERM GOAL #3   Title Pt will ambulate 500' over unlevel, outdoor surfaces with mod I using LRAD to indicate increased stability/independence with community mobility. Target date: 01/27/15.   Status On-going   PT LONG TERM GOAL #4   Title Pt will independently negotiate curb step and outdoor inclined/declined surfaces with no overt LOB to indicate safety traversing community obstacles. Target date: 01/27/15.   Status On-going   PT LONG TERM GOAL #5   Title Pt will increase Neuro QoL Lower Extremity score >/= 10 points from baseline to indicate improved quality  of life related to LE function. Target date: 01/27/15.   Baseline NeuroQOL LE: 34.3   Status On-going               Plan - 12/30/14 2121    Clinical Impression Statement Pt has met 3 of 4 STG's, increasing DGI score from 13 to 18/24. Pt did not meet STG for stair negotiation. Required blocked practice, increased cueing for sequencing with stair negotiation to maximize safety. Pt does exhibit increased awareness, self-correction of gait deviations within cueing during this session. Continue per POC.   Pt will benefit from skilled therapeutic intervention in order to improve on the following deficits Abnormal gait;Decreased activity tolerance;Decreased balance;Decreased coordination;Decreased strength;Impaired perceived functional ability;Improper body mechanics;Postural dysfunction;Decreased mobility;Decreased cognition   Rehab Potential Excellent   PT Frequency 2x / week   PT Duration 8 weeks   PT Treatment/Interventions ADLs/Self Care Home Management;Vestibular;Functional mobility training;Stair training;Gait training;Therapeutic activities;Therapeutic exercise;Balance training;Neuromuscular re-education;Patient/family education;Orthotic Fit/Training;Passive range of motion;Manual techniques;Energy conservation   PT Next Visit Plan Continue to address postural/gait deviations. Gait  training with L PLS AFO (in cabinet above Blair's desk) during PT. Continue to recommend [facility] for all mobility.   PT Home Exercise Plan See Pt Instructions on 7/25 for full HEP. Ankle strengthening HEP on 8/11.   Recommended Other Services 8/11: Pt provided with application for financial aid through [place] for financial assist with L AFO.   Consulted and Agree with Plan of Care Patient        Problem List There are no active problems to display for this patient.   Billie Ruddy, PT, DPT [facility] [address] [place], [place], [address] Phone: [phone]   Fax:  [phone] 12/30/2014, 9:24 PM

## 2015-01-03 ENCOUNTER — Ambulatory Visit: Payer: Self-pay | Admitting: Physical Therapy

## 2015-01-04 ENCOUNTER — Ambulatory Visit: Payer: 59 | Admitting: Physical Therapy

## 2015-01-04 DIAGNOSIS — M21372 Foot drop, left foot: Secondary | ICD-10-CM

## 2015-01-04 DIAGNOSIS — R2681 Unsteadiness on feet: Secondary | ICD-10-CM

## 2015-01-04 DIAGNOSIS — R531 Weakness: Secondary | ICD-10-CM

## 2015-01-04 DIAGNOSIS — R269 Unspecified abnormalities of gait and mobility: Secondary | ICD-10-CM | POA: Diagnosis not present

## 2015-01-04 NOTE — Therapy (Signed)
[facility] [address] [place], [place], [address] Phone: [phone]   Fax:  [phone]  Physical Therapy Treatment  Patient Details  Name: Amanda Li MRN: [id number] Date of Birth: 03-03-1956 Referring Provider:  Marton Redwood, MD  Encounter Date: 01/04/2015      PT End of Session - 01/04/15 1258    Visit Number 9   Number of Visits 17   Date for PT Re-Evaluation 01/31/15   Authorization Type [employer]   PT Start Time 1146   PT Stop Time 1230   PT Time Calculation (min) 44 min   Equipment Utilized During Treatment Gait belt   Activity Tolerance Patient tolerated treatment well   Behavior During Therapy [facility] for tasks assessed/performed      Past Medical History  Diagnosis Date  . Allergy     seasonal  . Depression   . Neuromuscular disorder     MS  . Thyroid disease     hypothyroid  . Multiple sclerosis     Past Surgical History  Procedure Laterality Date  . Cesarean section      x2  . Laparoscopic abdominal exploration      for endometriosis    There were no vitals filed for this visit.  Visit Diagnosis:  Abnormality of gait  Generalized weakness  Foot drop, left  Unsteadiness      Subjective Assessment - 01/04/15 1201    Subjective "I have trouble keeping my balance when I'm bending over to clean up the floor when my puppy has an accident." Requesting clarification/help on home calf stretch. No pain in B ankles. No falls.   Pertinent History Secondary progressive multiple sclerosis (MS diagnosed in 1988), hypothyroidism, depression   Limitations Walking;House hold activities   Patient Stated Goals "Anything that can help me walk easier and not get that leaning to one side."   Currently in Pain? No/denies                         [facility] Adult PT Treatment/Exercise - 01/04/15 0001    Transfers   Sit to Stand 6: Modified independent (Device/Increase time)    Stand to Sit 6: Modified independent (Device/Increase time)   Ambulation/Gait   Ambulation/Gait Yes   Ambulation/Gait Assistance 4: Min guard;4: Min assist   Ambulation Distance (Feet) 280 Feet  x1 indoors; 330 outdoors   Assistive device Straight cane  L AFO   Gait Pattern Step-through pattern;Lateral hip instability;Trendelenburg;Lateral trunk lean to right;Trunk rotated posteriorly on left;Decreased hip/knee flexion - left;Trunk flexed;Left foot flat;Narrow base of support   Ambulation Surface Level;Indoor   Stairs Yes   Stairs Assistance 5: Supervision   Stairs Assistance Details (indicate cue type and reason) No cueing required for sequencing, technique   Stair Management Technique One rail Left;Step to pattern;Forwards   Number of Stairs 8   Height of Stairs 6   Gait Comments Pt required blocked practice of stair negotiation for carryover of sequencing, technique   Therapeutic Activites    Therapeutic Activities Other Therapeutic Activities   Other Therapeutic Activities Instructed pt in floor transfer with cueing for technique, use of stable furniture for UE support, and energy conservation. Pt then performed floor transfer with supervision for initial trial, mod I for subseuqent trial. Discussed and performed how to safely clean floor while maintaining balance (focused on utilizing stable piece of furniture for single UE support).   Exercises   Exercises Other Exercises  Other Exercises  Pt performed B gastroc then soleus stretches 4 x30-second holds per side on each muscle group. Verbal/tactile cueing required for proper technique with effective within-session carryover.                PT Education - 01/04/15 1244    Education provided Yes   Education Details Floor transfer. Reviewed calf stretch.   Person(s) Educated Patient   Methods Explanation;Demonstration;Verbal cues;Tactile cues   Comprehension Verbalized understanding;Returned demonstration           PT Short Term Goals - 01/04/15 1258    PT SHORT TERM GOAL #1   Title Pt will perform home exercises with mod I using paper handout to indicate safe daily compliance with HEP. Target date: 12/30/14.   Baseline Met 12/13/14.   Status Achieved   PT SHORT TERM GOAL #2   Title Pt will demonstrate ability to self-correct posture with subtle cueing to indicate improved postural awareness. Target date: 12/30/14.   Status Achieved   PT SHORT TERM GOAL #3   Title Pt will increase Dynamic Gait Index score from 13/24 to 17/24 to indicate increased gait stability in presence of external demands. Target date: 12/30/14.   Baseline 12/30/14: DGI score = 18/124   Status Achieved   PT SHORT TERM GOAL #4   Title Pt will negotiate 2 stairs with single L rail and mod I, increased time to enable pt to use all home entrances. Target date: 12/30/14.   Baseline Met 8/16.   Status Achieved           PT Long Term Goals - 12/07/14 1613    PT LONG TERM GOAL #1   Title Pt will increase self-selected gait speed from 2.94 ft/sec to 3.74 ft/sec to indicate increased efficiency of ambulation. Target date: 01/27/15.   Status On-going   PT LONG TERM GOAL #2   Title Pt will increase Dynamic Gait Index score from 13 to 20/24 to indicate decreased risk for falls. Target date: 01/27/15.   Status On-going   PT LONG TERM GOAL #3   Title Pt will ambulate 500' over unlevel, outdoor surfaces with mod I using LRAD to indicate increased stability/independence with community mobility. Target date: 01/27/15.   Status On-going   PT LONG TERM GOAL #4   Title Pt will independently negotiate curb step and outdoor inclined/declined surfaces with no overt LOB to indicate safety traversing community obstacles. Target date: 01/27/15.   Status On-going   PT LONG TERM GOAL #5   Title Pt will increase Neuro QoL Lower Extremity score >/= 10 points from baseline to indicate improved quality of life related to LE function. Target date: 01/27/15.    Baseline NeuroQOL LE: 34.3   Status On-going               Plan - 01/04/15 1259    Clinical Impression Statement Pt has now met all STG's, as pt did negotiate 8 stairs with single L rail and mod I during this session; no cueing required for technique. Session focused on floor transfer, reviewing HEP, and gait training. Pt will continue to benefit from skilled outpatient PT to increase stability/independence with functional mobility. Continue per POC.   Pt will benefit from skilled therapeutic intervention in order to improve on the following deficits Abnormal gait;Decreased activity tolerance;Decreased balance;Decreased coordination;Decreased strength;Impaired perceived functional ability;Improper body mechanics;Postural dysfunction;Decreased mobility;Decreased cognition   Rehab Potential Excellent   PT Frequency 2x / week   PT Duration 8 weeks     PT Treatment/Interventions ADLs/Self Care Home Management;Vestibular;Functional mobility training;Stair training;Gait training;Therapeutic activities;Therapeutic exercise;Balance training;Neuromuscular re-education;Patient/family education;Orthotic Fit/Training;Passive range of motion;Manual techniques;Energy conservation   PT Next Visit Plan Continue to address postural/gait deviations. Gait training with L PLS AFO (in cabinet above Blair's desk) during PT. Continue to recommend [facility] for all mobility.   PT Home Exercise Plan See Pt Instructions on 7/25 for full HEP. Ankle strengthening HEP on 8/11.   Recommended Other Services 8/16: Pt has received all documentation from orthotist and PT to pursue financial aid for L AFO from [place].   Consulted and Agree with Plan of Care Patient        Problem List There are no active problems to display for this patient.   Billie Ruddy, PT, DPT [facility] [address] [place], [place], [address] Phone: [phone]   Fax:   [phone] 01/04/2015, 1:03 PM

## 2015-01-05 ENCOUNTER — Ambulatory Visit: Payer: 59 | Admitting: Physical Therapy

## 2015-01-05 DIAGNOSIS — R269 Unspecified abnormalities of gait and mobility: Secondary | ICD-10-CM

## 2015-01-05 DIAGNOSIS — R531 Weakness: Secondary | ICD-10-CM

## 2015-01-05 DIAGNOSIS — R2681 Unsteadiness on feet: Secondary | ICD-10-CM

## 2015-01-05 DIAGNOSIS — M21372 Foot drop, left foot: Secondary | ICD-10-CM

## 2015-01-05 NOTE — Therapy (Signed)
[facility] [address] [place], [place], [address] Phone: [phone]   Fax:  [phone]  Physical Therapy Treatment  Patient Details  Name: Amanda Li MRN: [id number] Date of Birth: 06/24/1955 Referring Provider:  Marton Redwood, MD  Encounter Date: 01/05/2015      PT End of Session - 01/05/15 1309    Visit Number 10   Number of Visits 17   Date for PT Re-Evaluation 01/31/15   Authorization Type [employer]   PT Start Time 1101   PT Stop Time 1146   PT Time Calculation (min) 45 min   Equipment Utilized During Treatment Gait belt   Activity Tolerance Patient tolerated treatment well   Behavior During Therapy [facility] for tasks assessed/performed      Past Medical History  Diagnosis Date  . Allergy     seasonal  . Depression   . Neuromuscular disorder     MS  . Thyroid disease     hypothyroid  . Multiple sclerosis     Past Surgical History  Procedure Laterality Date  . Cesarean section      x2  . Laparoscopic abdominal exploration      for endometriosis    There were no vitals filed for this visit.  Visit Diagnosis:  Abnormality of gait  Generalized weakness  Foot drop, left  Unsteadiness      Subjective Assessment - 01/05/15 1300    Subjective Pt denies falls, reports no pain. Performing home exercises without any problem.   Pertinent History Secondary progressive multiple sclerosis (MS diagnosed in 1988), hypothyroidism, depression   Limitations Walking;House hold activities   Patient Stated Goals "Anything that can help me walk easier and not get that leaning to one side."   Currently in Pain? No/denies                         [facility] Adult PT Treatment/Exercise - 01/05/15 0001    Transfers   Sit to Stand 6: Modified independent (Device/Increase time)   Stand to Sit 6: Modified independent (Device/Increase time)   Ambulation/Gait   Ambulation/Gait Yes    Ambulation/Gait Assistance 4: Min guard   Ambulation/Gait Assistance Details Cueing to address B hip internal rotation, upright posture (focus on B hip extension, forward gaze), and appropriate BOS    Ambulation Distance (Feet) 350 Feet  x1 indoors; 330 outdoors   Assistive device Straight cane  L AFO   Gait Pattern Step-through pattern;Lateral hip instability;Trendelenburg;Lateral trunk lean to right;Trunk rotated posteriorly on left;Decreased hip/knee flexion - left;Trunk flexed;Left foot flat;Narrow base of support   Ambulation Surface Level;Indoor   Ramp 4: Min assist   Ramp Details (indicate cue type and reason) blocked pratice with SPC   Curb 4: Min assist   Curb Details (indicate cue type and reason) Blocked practice of curb step negotiation with L AFO and SPC. Pt initially required demonstration/verbal cueing for technique with SPC. Pt demonstrated effective within-session carryover after blocked pratice.   Neuro Re-ed    Neuro Re-ed Details  Pt performed the following activities in tall kneeling for increased proprioceptive input, improved proximal stability/control: forward/retro stepping with BLE's and min A for stability with RLE advancement due to decreased trunk control/lateral hip stability. Standing with yellow Tband anchored at L foot for increased proprioceptive input, pt tapped L toe on targets of varying heights x30 reps with tactile cueing at L ribcage for postureal control. No UE support but min A required.  Modified Tband position to knee, then performed for additional 16 reps consecutively. Performed final 2 trials x6 reps without band with improved trunk control, more accurate LLE placement. B alternating step taps onto targets on 2" step for increased stability with lateral weight shifting, wider BOS.   Exercises   Exercises --   Other Exercises  --                PT Education - 01/04/15 1244    Education provided Yes   Education Details Floor transfer. Reviewed  calf stretch.   Person(s) Educated Patient   Methods Explanation;Demonstration;Verbal cues;Tactile cues   Comprehension Verbalized understanding;Returned demonstration          PT Short Term Goals - 01/04/15 1258    PT SHORT TERM GOAL #1   Title Pt will perform home exercises with mod I using paper handout to indicate safe daily compliance with HEP. Target date: 12/30/14.   Baseline Met 12/13/14.   Status Achieved   PT SHORT TERM GOAL #2   Title Pt will demonstrate ability to self-correct posture with subtle cueing to indicate improved postural awareness. Target date: 12/30/14.   Status Achieved   PT SHORT TERM GOAL #3   Title Pt will increase Dynamic Gait Index score from 13/24 to 17/24 to indicate increased gait stability in presence of external demands. Target date: 12/30/14.   Baseline 12/30/14: DGI score = 18/124   Status Achieved   PT SHORT TERM GOAL #4   Title Pt will negotiate 2 stairs with single L rail and mod I, increased time to enable pt to use all home entrances. Target date: 12/30/14.   Baseline Met 8/16.   Status Achieved           PT Long Term Goals - 12/07/14 1613    PT LONG TERM GOAL #1   Title Pt will increase self-selected gait speed from 2.94 ft/sec to 3.74 ft/sec to indicate increased efficiency of ambulation. Target date: 01/27/15.   Status On-going   PT LONG TERM GOAL #2   Title Pt will increase Dynamic Gait Index score from 13 to 20/24 to indicate decreased risk for falls. Target date: 01/27/15.   Status On-going   PT LONG TERM GOAL #3   Title Pt will ambulate 500' over unlevel, outdoor surfaces with mod I using LRAD to indicate increased stability/independence with community mobility. Target date: 01/27/15.   Status On-going   PT LONG TERM GOAL #4   Title Pt will independently negotiate curb step and outdoor inclined/declined surfaces with no overt LOB to indicate safety traversing community obstacles. Target date: 01/27/15.   Status On-going   PT LONG TERM  GOAL #5   Title Pt will increase Neuro QoL Lower Extremity score >/= 10 points from baseline to indicate improved quality of life related to LE function. Target date: 01/27/15.   Baseline NeuroQOL LE: 34.3   Status On-going               Plan - 01/05/15 1310    Clinical Impression Statement Session focused on increased proximal stability/control with focus on trunk, pelvis, and L hip stability. Pt is making progress toward LTG's but will continue to benefit from skilled outpatient PT to increase stability/independence with functional mobility and ddecrease fall risk.    Pt will benefit from skilled therapeutic intervention in order to improve on the following deficits Abnormal gait;Decreased activity tolerance;Decreased balance;Decreased coordination;Decreased strength;Impaired perceived functional ability;Improper body mechanics;Postural dysfunction;Decreased mobility;Decreased cognition   Rehab Potential  Excellent   PT Frequency 2x / week   PT Duration 8 weeks   PT Treatment/Interventions ADLs/Self Care Home Management;Vestibular;Functional mobility training;Stair training;Gait training;Therapeutic activities;Therapeutic exercise;Balance training;Neuromuscular re-education;Patient/family education;Orthotic Fit/Training;Passive range of motion;Manual techniques;Energy conservation   PT Next Visit Plan Proximal stability/control. Consider tall/half kneeling. Provide cueing for pt awareness of postural alignment/gait deviations. Gait training with L PLS AFO (in cabinet above Blair's desk) during PT.   PT Home Exercise Plan See Pt Instructions on 7/25 for full HEP. Ankle strengthening HEP on 8/11.   Consulted and Agree with Plan of Care Patient       Billie Ruddy, PT, DPT [facility] [address] [place], [place], [address] Phone: [phone]   Fax:  [phone] 01/05/2015, 1:14 PM

## 2015-01-06 ENCOUNTER — Ambulatory Visit
Admission: RE | Admit: 2015-01-06 | Discharge: 2015-01-06 | Disposition: A | Discharge status: Home / Self Care | Payer: 59 | Source: Ambulatory Visit | Attending: Psychiatry | Admitting: Psychiatry

## 2015-01-06 DIAGNOSIS — G35 Multiple sclerosis: Secondary | ICD-10-CM

## 2015-01-06 DIAGNOSIS — G35D Multiple sclerosis, unspecified: Secondary | ICD-10-CM

## 2015-01-06 MED ORDER — GADOBENATE DIMEGLUMINE 529 MG/ML IV SOLN
10.0000 mL | Freq: Once | INTRAVENOUS | Status: AC | PRN
  Administered 2015-01-06: 10 mL via INTRAVENOUS

## 2015-01-10 ENCOUNTER — Ambulatory Visit: Payer: 59 | Admitting: Physical Therapy

## 2015-01-10 DIAGNOSIS — R269 Unspecified abnormalities of gait and mobility: Secondary | ICD-10-CM

## 2015-01-10 DIAGNOSIS — R2681 Unsteadiness on feet: Secondary | ICD-10-CM

## 2015-01-10 DIAGNOSIS — R531 Weakness: Secondary | ICD-10-CM

## 2015-01-10 DIAGNOSIS — M21372 Foot drop, left foot: Secondary | ICD-10-CM

## 2015-01-10 NOTE — Therapy (Signed)
[facility] [address] [place], [place], [address] Phone: [phone]   Fax:  [phone]  Physical Therapy Treatment  Patient Details  Name: Amanda Li MRN: [id number] Date of Birth: 1955/08/07 Referring Provider:  Marton Redwood, MD  Encounter Date: 01/10/2015      PT End of Session - 01/10/15 1251    Visit Number 11   Number of Visits 17   Date for PT Re-Evaluation 01/31/15   Authorization Type [employer]   PT Start Time 1016   PT Stop Time 1103   PT Time Calculation (min) 47 min   Equipment Utilized During Treatment Gait belt   Activity Tolerance Patient tolerated treatment well   Behavior During Therapy [facility] for tasks assessed/performed      Past Medical History  Diagnosis Date  . Allergy     seasonal  . Depression   . Neuromuscular disorder     MS  . Thyroid disease     hypothyroid  . Multiple sclerosis     Past Surgical History  Procedure Laterality Date  . Cesarean section      x2  . Laparoscopic abdominal exploration      for endometriosis    There were no vitals filed for this visit.  Visit Diagnosis:  Abnormality of gait  Generalized weakness  Foot drop, left  Unsteadiness      Subjective Assessment - 01/10/15 1020    Subjective Pt denies falls. Reports pain in L ankle today. Pt stated, "I've noticed that the MS has really affected my cognition."   Pertinent History Secondary progressive multiple sclerosis (MS diagnosed in 1988), hypothyroidism, depression   Limitations Walking;House hold activities   Patient Stated Goals "Anything that can help me walk easier and not get that leaning to one side."   Currently in Pain? Yes   Pain Score 5    Pain Location Ankle   Pain Orientation Left   Pain Descriptors / Indicators Sharp   Pain Type Chronic pain   Pain Onset In the past 7 days   Pain Frequency Intermittent   Aggravating Factors  "Walking" (specifically, during  L toe off, per pt demonstration)   Pain Relieving Factors "Nothing right now. Usually Aleve helps but not right now."                         [facility] Adult PT Treatment/Exercise - 01/10/15 0001    Transfers   Sit to Stand 6: Modified independent (Device/Increase time)   Stand to Sit 6: Modified independent (Device/Increase time)   Ambulation/Gait   Ambulation/Gait Yes   Ambulation/Gait Assistance 4: Min guard   Ambulation Distance (Feet) 450 Feet  x1 indoors; 330 outdoors   Assistive device Straight cane  L AFO   Gait Pattern Step-through pattern;Lateral hip instability;Trendelenburg;Lateral trunk lean to right;Trunk rotated posteriorly on left;Decreased hip/knee flexion - left;Trunk flexed;Left foot flat;Narrow base of support   Ambulation Surface Level;Indoor   Ramp 4: Min assist   Curb 4: Min assist   Gait Comments With 2 lb cuff weight on L ankle for increased proprioceptive input, pt negotiated 5 hurdles of varying heigths (6-10" from ground) to promote increased attention to L hip/knee flexion during LLE advancement. Pt negotiated 5 hurdles forward x4 reps without AD with min A for stability and effective negotiation of ~75% hurdles. After removing L ankle cuff weight, pt required consistent min-mod A and effectively negotiated < 25% of hurdles. Noted decreased L  hip extension during LLE advancement. During final trial without cuff weight, pt sustained frequent anterior LOB with pt difficulty recovering.   Neuro Re-ed    Neuro Re-ed Details  Supine with R knee extended, LLE off EOM on 6" step, pt performed L hip extension/knee flexion to lift L hip off mat x10 reps with cueing, then x20 reps without cueing. Added to HEP for increased activation of L gluteus maximus/medius with concurrent L knee flexion. Standing without UE support, pt performed alternating step taps on 6" step x12 reps with cueing for increased L single limb stance stability. Performed the following in tall  kneeling without UE support: forward/retro stepping x10 reps with cueing to maintain neutral L hip extension; transtioned to static L half kneeling x 4 minutes with single UE support at physioball with focus on increased L knee/ankle stability. Pt unable to tolerate R half kneeling due to discomfort.                PT Education - 01/10/15 1237    Education provided Yes   Education Details HEP: added L hip extension/knee flexion; see Pt Instructions. Discussed speech therapy services for cognitive impairments.   Person(s) Educated Patient   Methods Explanation;Demonstration;Handout;Verbal cues   Comprehension Verbalized understanding;Returned demonstration          PT Short Term Goals - 01/04/15 1258    PT SHORT TERM GOAL #1   Title Pt will perform home exercises with mod I using paper handout to indicate safe daily compliance with HEP. Target date: 12/30/14.   Baseline Met 12/13/14.   Status Achieved   PT SHORT TERM GOAL #2   Title Pt will demonstrate ability to self-correct posture with subtle cueing to indicate improved postural awareness. Target date: 12/30/14.   Status Achieved   PT SHORT TERM GOAL #3   Title Pt will increase Dynamic Gait Index score from 13/24 to 17/24 to indicate increased gait stability in presence of external demands. Target date: 12/30/14.   Baseline 12/30/14: DGI score = 18/124   Status Achieved   PT SHORT TERM GOAL #4   Title Pt will negotiate 2 stairs with single L rail and mod I, increased time to enable pt to use all home entrances. Target date: 12/30/14.   Baseline Met 8/16.   Status Achieved           PT Long Term Goals - 12/07/14 1613    PT LONG TERM GOAL #1   Title Pt will increase self-selected gait speed from 2.94 ft/sec to 3.74 ft/sec to indicate increased efficiency of ambulation. Target date: 01/27/15.   Status On-going   PT LONG TERM GOAL #2   Title Pt will increase Dynamic Gait Index score from 13 to 20/24 to indicate decreased risk  for falls. Target date: 01/27/15.   Status On-going   PT LONG TERM GOAL #3   Title Pt will ambulate 500' over unlevel, outdoor surfaces with mod I using LRAD to indicate increased stability/independence with community mobility. Target date: 01/27/15.   Status On-going   PT LONG TERM GOAL #4   Title Pt will independently negotiate curb step and outdoor inclined/declined surfaces with no overt LOB to indicate safety traversing community obstacles. Target date: 01/27/15.   Status On-going   PT LONG TERM GOAL #5   Title Pt will increase Neuro QoL Lower Extremity score >/= 10 points from baseline to indicate improved quality of life related to LE function. Target date: 01/27/15.   Baseline NeuroQOL LE: 34.3  Status On-going               Plan - 01/10/15 1251    Clinical Impression Statement Addressed proximal stability control, kinesthic awareness of LLE. Pt exhibits improved awareness of postural/gait stability and is able to self-correct with only subtle cueing/ Continue per POC.   Pt will benefit from skilled therapeutic intervention in order to improve on the following deficits Abnormal gait;Decreased activity tolerance;Decreased balance;Decreased coordination;Decreased strength;Impaired perceived functional ability;Improper body mechanics;Postural dysfunction;Decreased mobility;Decreased cognition   Rehab Potential Excellent   PT Frequency 2x / week   PT Duration 8 weeks   PT Treatment/Interventions ADLs/Self Care Home Management;Vestibular;Functional mobility training;Stair training;Gait training;Therapeutic activities;Therapeutic exercise;Balance training;Neuromuscular re-education;Patient/family education;Orthotic Fit/Training;Passive range of motion;Manual techniques;Energy conservation   PT Next Visit Plan Continue to address L single limb stance stability; recovery from anterior LOB (both ankle and hip strategies). Gait training with L PLS AFO (in cabinet above Lova Urbieta's desk) during PT.    Consulted and Agree with Plan of Care Patient        Problem List There are no active problems to display for this patient.   Billie Ruddy, PT, DPT [facility] [address] [place], [place], [address] Phone: [phone]   Fax:  [phone] 01/10/2015, 12:58 PM

## 2015-01-10 NOTE — Patient Instructions (Addendum)
   Lie on your back on a lower surface with your right leg straight and your LEFT leg hanging off the edge of the mat. Place your LEFT foot on the small stool we talked about during PT. Push your left foot into the stool to lift the left side of your hips off the bed. Hold for 1-2 seconds.  Do this 20 times, 1-2 sets per day.  Copyright  [employer]. All rights reserved.

## 2015-01-12 ENCOUNTER — Ambulatory Visit: Payer: 59 | Admitting: Physical Therapy

## 2015-01-12 DIAGNOSIS — R269 Unspecified abnormalities of gait and mobility: Secondary | ICD-10-CM | POA: Diagnosis not present

## 2015-01-12 DIAGNOSIS — R2681 Unsteadiness on feet: Secondary | ICD-10-CM

## 2015-01-12 DIAGNOSIS — R531 Weakness: Secondary | ICD-10-CM

## 2015-01-12 DIAGNOSIS — M21372 Foot drop, left foot: Secondary | ICD-10-CM

## 2015-01-12 NOTE — Therapy (Signed)
[facility] [address] [place], [place], [address] Phone: [phone]   Fax:  [phone]  Physical Therapy Treatment  Patient Details  Name: Amanda Li MRN: [id number] Date of Birth: Apr 17, 1956 Referring Provider:  Marton Redwood, MD  Encounter Date: 01/12/2015      PT End of Session - 01/12/15 2041    Visit Number 12   Number of Visits 17   Date for PT Re-Evaluation 01/31/15   Authorization Type [employer]   PT Start Time 1018   PT Stop Time 1101   PT Time Calculation (min) 43 min   Equipment Utilized During Treatment Gait belt   Activity Tolerance Patient tolerated treatment well   Behavior During Therapy [facility] for tasks assessed/performed      Past Medical History  Diagnosis Date  . Allergy     seasonal  . Depression   . Neuromuscular disorder     MS  . Thyroid disease     hypothyroid  . Multiple sclerosis     Past Surgical History  Procedure Laterality Date  . Cesarean section      x2  . Laparoscopic abdominal exploration      for endometriosis    There were no vitals filed for this visit.  Visit Diagnosis:  Abnormality of gait  Generalized weakness  Foot drop, left  Unsteadiness      Subjective Assessment - 01/12/15 1025    Subjective Pt denies falls. Reports pain in L ankle today. Pt stated, "I've noticed that the MS has really affected my cognition."   Currently in Pain? Yes   Pain Score 3    Pain Location Ankle   Pain Orientation Left   Pain Descriptors / Indicators Sharp   Pain Type Chronic pain   Pain Onset In the past 7 days   Pain Frequency Intermittent   Aggravating Factors  "walking"   Pain Relieving Factors "nothing right now"   Effect of Pain on Daily Activities "when it's bad, it tends to really limit what I do"   Multiple Pain Sites No                         [facility] Adult PT Treatment/Exercise - 01/12/15 0001    Transfers   Sit to  Stand 6: Modified independent (Device/Increase time)   Stand to Sit 6: Modified independent (Device/Increase time)   Ambulation/Gait   Ambulation/Gait Yes   Ambulation/Gait Assistance 4: Min guard;4: Min assist;5: Supervision;6: Modified independent (Device/Increase time)   Ambulation/Gait Assistance Details With SPC, pt performs linear gait with mod I over level, indoor surfaces for < 100'. Supervision with turning, head turns, obstacle negotiation, or for all gait >100' indoors; Min guard to Min A over unlevel surfaces secondary to frequent L toe catch. Using RW, pt mod I for all gait over level surfaces up to 300',; supervision for gait >200' over outdoor, paved surfaces with exception of Min guard due to minor LOB with effective self-recovery due to L toe catch on sidewalk irregularity.   Ambulation Distance (Feet) 1000 Feet  >400' outdoors; remainder indoors   Assistive device Straight cane  L AFO   Gait Pattern Step-through pattern;Lateral hip instability;Trendelenburg;Lateral trunk lean to right;Trunk rotated posteriorly on left;Decreased hip/knee flexion - left;Trunk flexed;Left foot flat;Narrow base of support  L Trendelenberg   Ambulation Surface Level;Unlevel;Indoor;Outdoor;Paved   Gait velocity 2.35 ft/sec  with RW   Ramp 5: Supervision   Ramp Details (indicate cue  type and reason) with RW and L AFO   Curb 5: Supervision   Curb Details (indicate cue type and reason) with RW and L AFO, required cueing for sequencing with effective return demonstration   Neuro Re-ed    Neuro Re-ed Details  --                PT Education - 01/12/15 2034    Education provided Yes   Education Details Recommending RW for outdoor mobility, community distances.   Person(s) Educated Patient   Methods Explanation   Comprehension Verbalized understanding          PT Short Term Goals - 01/04/15 1258    PT SHORT TERM GOAL #1   Title Pt will perform home exercises with mod I using paper  handout to indicate safe daily compliance with HEP. Target date: 12/30/14.   Baseline Met 12/13/14.   Status Achieved   PT SHORT TERM GOAL #2   Title Pt will demonstrate ability to self-correct posture with subtle cueing to indicate improved postural awareness. Target date: 12/30/14.   Status Achieved   PT SHORT TERM GOAL #3   Title Pt will increase Dynamic Gait Index score from 13/24 to 17/24 to indicate increased gait stability in presence of external demands. Target date: 12/30/14.   Baseline 12/30/14: DGI score = 18/124   Status Achieved   PT SHORT TERM GOAL #4   Title Pt will negotiate 2 stairs with single L rail and mod I, increased time to enable pt to use all home entrances. Target date: 12/30/14.   Baseline Met 8/16.   Status Achieved           PT Long Term Goals - 12/07/14 1613    PT LONG TERM GOAL #1   Title Pt will increase self-selected gait speed from 2.94 ft/sec to 3.74 ft/sec to indicate increased efficiency of ambulation. Target date: 01/27/15.   Status On-going   PT LONG TERM GOAL #2   Title Pt will increase Dynamic Gait Index score from 13 to 20/24 to indicate decreased risk for falls. Target date: 01/27/15.   Status On-going   PT LONG TERM GOAL #3   Title Pt will ambulate 500' over unlevel, outdoor surfaces with mod I using LRAD to indicate increased stability/independence with community mobility. Target date: 01/27/15.   Status On-going   PT LONG TERM GOAL #4   Title Pt will independently negotiate curb step and outdoor inclined/declined surfaces with no overt LOB to indicate safety traversing community obstacles. Target date: 01/27/15.   Status On-going   PT LONG TERM GOAL #5   Title Pt will increase Neuro QoL Lower Extremity score >/= 10 points from baseline to indicate improved quality of life related to LE function. Target date: 01/27/15.   Baseline NeuroQOL LE: 34.3   Status On-going               Plan - 01/12/15 2042    Clinical Impression Statement  Recommending RW for all ambulation over unlevel/outdoor surfaces and for community distances, as pt exhibited significant improvement in gait stability when utilizing RW (and AFO) as compared with gait with SPC. Pt amenable to using RW. Continue per POC.   Pt will benefit from skilled therapeutic intervention in order to improve on the following deficits Abnormal gait;Decreased activity tolerance;Decreased balance;Decreased coordination;Decreased strength;Impaired perceived functional ability;Improper body mechanics;Postural dysfunction;Decreased mobility;Decreased cognition   Rehab Potential Excellent   PT Frequency 2x / week   PT Duration 8 weeks  PT Treatment/Interventions ADLs/Self Care Home Management;Vestibular;Functional mobility training;Stair training;Gait training;Therapeutic activities;Therapeutic exercise;Balance training;Neuromuscular re-education;Patient/family education;Orthotic Fit/Training;Passive range of motion;Manual techniques;Energy conservation   PT Next Visit Plan Consider faxing MD order request for RW.  May also trial rollator. Continue to address community mobility with RW. Re-visit curb step negotiation with RW.   Consulted and Agree with Plan of Care Patient        Problem List There are no active problems to display for this patient.   Billie Ruddy, PT, [facility] [address] [place], [place], [address] Phone: [phone]   Fax:  [phone] 01/12/2015, 8:47 PM

## 2015-01-17 ENCOUNTER — Ambulatory Visit: Payer: 59 | Admitting: Physical Therapy

## 2015-01-17 DIAGNOSIS — R269 Unspecified abnormalities of gait and mobility: Secondary | ICD-10-CM | POA: Diagnosis not present

## 2015-01-17 DIAGNOSIS — R531 Weakness: Secondary | ICD-10-CM

## 2015-01-17 DIAGNOSIS — R2681 Unsteadiness on feet: Secondary | ICD-10-CM

## 2015-01-17 DIAGNOSIS — M21372 Foot drop, left foot: Secondary | ICD-10-CM

## 2015-01-17 NOTE — Therapy (Signed)
[facility] [address] [place], [place], [address] Phone: [phone]   Fax:  [phone]  Physical Therapy Treatment  Patient Details  Name: Amanda Li MRN: [id number] Date of Birth: August 27, 1955 Referring Provider:  Marton Redwood, MD  Encounter Date: 01/17/2015      PT End of Session - 01/17/15 2013    Visit Number 13   Number of Visits 17   Date for PT Re-Evaluation 01/31/15   Authorization Type [employer]   PT Start Time 1021   PT Stop Time 1107   PT Time Calculation (min) 46 min   Equipment Utilized During Treatment Gait belt   Activity Tolerance Patient tolerated treatment well   Behavior During Therapy [facility] for tasks assessed/performed      Past Medical History  Diagnosis Date  . Allergy     seasonal  . Depression   . Neuromuscular disorder     MS  . Thyroid disease     hypothyroid  . Multiple sclerosis     Past Surgical History  Procedure Laterality Date  . Cesarean section      x2  . Laparoscopic abdominal exploration      for endometriosis    There were no vitals filed for this visit.  Visit Diagnosis:  Abnormality of gait  Generalized weakness  Foot drop, left  Unsteadiness      Subjective Assessment - 01/17/15 1027    Subjective Pt arrived to this session using RW. States, "It's my sister's mother-in-law's walker, and I didn't realize that it could fit me." Pt reports no pain at this time, but does report L ankle pain "when I first got up" which subsided after standing, walking.    Patient is accompained by: Family member   Pertinent History Secondary progressive multiple sclerosis (MS diagnosed in 1988), hypothyroidism, depression   Limitations Walking;House hold activities   Patient Stated Goals "Anything that can help me walk easier and not get that leaning to one side."   Currently in Pain? No/denies                         [facility] Adult PT  Treatment/Exercise - 01/17/15 0001    Ambulation/Gait   Ambulation/Gait Yes   Ambulation/Gait Assistance 6: Modified independent (Device/Increase time);5: Supervision   Ambulation/Gait Assistance Details Trialed rollator [phone] with supervision, cueing for safe/proper use of AD. Mod I for gait over level, indoor surfaces and for majority of unlevel, paved surfaces. Supervision required over declined surface. Pt sustained single LOB with effective self-recovery when ambulating over uneven concrete joint.   Ambulation Distance (Feet) 1200 Feet   Assistive device Rolling walker;Rollator  L AFO   Gait Pattern Step-through pattern;Lateral hip instability;Trendelenburg;Lateral trunk lean to right;Decreased hip/knee flexion - left;Left foot flat;Narrow base of support  L Trendelenberg   Ambulation Surface Level;Unlevel;Indoor;Outdoor;Paved   Ramp 5: Supervision   Ramp Details (indicate cue type and reason) with cueing for hand placement, technique to control descent down ramp suing RW   Curb 6: Modified independent (Device/increase time);5: Supervision   Curb Details (indicate cue type and reason) x3 trials with RW. Cueing for staggered foot position when raising/lowering walker with effective within-session carryover. Pt did demo effective between-sesion carryover of sequencing.                PT Education - 01/17/15 2003    Education provided Yes   Education Details Recommending RW for all mobility.   Person(s)  Educated Patient   Methods Explanation   Comprehension Verbalized understanding          PT Short Term Goals - 01/04/15 1258    PT SHORT TERM GOAL #1   Title Pt will perform home exercises with mod I using paper handout to indicate safe daily compliance with HEP. Target date: 12/30/14.   Baseline Met 12/13/14.   Status Achieved   PT SHORT TERM GOAL #2   Title Pt will demonstrate ability to self-correct posture with subtle cueing to indicate improved postural awareness.  Target date: 12/30/14.   Status Achieved   PT SHORT TERM GOAL #3   Title Pt will increase Dynamic Gait Index score from 13/24 to 17/24 to indicate increased gait stability in presence of external demands. Target date: 12/30/14.   Baseline 12/30/14: DGI score = 18/124   Status Achieved   PT SHORT TERM GOAL #4   Title Pt will negotiate 2 stairs with single L rail and mod I, increased time to enable pt to use all home entrances. Target date: 12/30/14.   Baseline Met 8/16.   Status Achieved           PT Long Term Goals - 01/17/15 2019    PT LONG TERM GOAL #1   Title Pt will increase self-selected gait speed from 2.94 ft/sec to 3.74 ft/sec to indicate increased efficiency of ambulation. Target date: 01/27/15.   Status On-going   PT LONG TERM GOAL #2   Title Pt will increase Dynamic Gait Index score from 13 to 20/24 to indicate decreased risk for falls. Target date: 01/27/15.   Status On-going   PT LONG TERM GOAL #3   Title Pt will ambulate 500' over unlevel, outdoor surfaces with mod I using LRAD to indicate increased stability/independence with community mobility. Target date: 01/27/15.   Status On-going   PT LONG TERM GOAL #4   Title Pt will independently negotiate curb step and outdoor inclined/declined surfaces with no overt LOB to indicate safety traversing community obstacles. Target date: 01/27/15.   Baseline Met 8/29.   Status Achieved   PT LONG TERM GOAL #5   Title Pt will increase Neuro QoL Lower Extremity score >/= 10 points from baseline to indicate improved quality of life related to LE function. Target date: 01/27/15.   Baseline NeuroQOL LE: 34.3   Status On-going               Plan - 01/17/15 1059    Clinical Impression Statement After trialing rollator, continue to recommend RW for all mobility, as pt exhibits significant improvement in stability/independence with ambulation in all environments with RW as compared with [facility] or rollator. Family member did give patient RW;  adjusted RW to fit pt during this session. Continue per POC.   Pt will benefit from skilled therapeutic intervention in order to improve on the following deficits Abnormal gait;Decreased activity tolerance;Decreased balance;Decreased coordination;Decreased strength;Impaired perceived functional ability;Improper body mechanics;Postural dysfunction;Decreased mobility;Decreased cognition   Rehab Potential Excellent   PT Frequency 2x / week   PT Duration 8 weeks   PT Treatment/Interventions ADLs/Self Care Home Management;Vestibular;Functional mobility training;Stair training;Gait training;Therapeutic activities;Therapeutic exercise;Balance training;Neuromuscular re-education;Patient/family education;Orthotic Fit/Training;Passive range of motion;Manual techniques;Energy conservation   PT Next Visit Plan Core muscle activation: progress from supine > seated > standing; use mirror to increase pt awareness of lateral displacement of trunk, pelvic instability with functional movement   Consulted and Agree with Plan of Care Patient        Problem List There  are no active problems to display for this patient.   Billie Ruddy, PT, [facility] [address] [place], [place], [address] Phone: [phone]   Fax:  [phone] 01/17/2015, 8:21 PM

## 2015-01-19 ENCOUNTER — Ambulatory Visit: Payer: 59 | Admitting: Physical Therapy

## 2015-01-19 DIAGNOSIS — M21372 Foot drop, left foot: Secondary | ICD-10-CM

## 2015-01-19 DIAGNOSIS — R269 Unspecified abnormalities of gait and mobility: Secondary | ICD-10-CM

## 2015-01-19 DIAGNOSIS — R531 Weakness: Secondary | ICD-10-CM

## 2015-01-19 DIAGNOSIS — R2681 Unsteadiness on feet: Secondary | ICD-10-CM

## 2015-01-19 NOTE — Patient Instructions (Signed)
Stabilization: Transverse Abdominus Contraction - Supine   Lie with knees bent, feet flat. Feel for this muscle by first finding your hip pointer bones, then moving 2 finger widths inward, and 2-3 finger-widths downward. Contract abdominals as though someone is about to drop a bowling ball on your stomach. You should feel your muscle contract underneath your finger tips.  Try to hold contraction for 5 seconds, then progress to 10 seconds. Repeat 10-15 times per session, 2-3 sessions per day.  When you feel as though you can consistently hold muscle contraction for 5 seconds, try to slightly lift then lower LEFT leg, then RIGHT leg. Goal: 3 leg lifts in a row without losing muscle contraction under finger tips.

## 2015-01-19 NOTE — Therapy (Signed)
[facility] [address] [place], [place], [address] Phone: [phone]   Fax:  [phone]  Physical Therapy Treatment  Patient Details  Name: Amanda Li MRN: [id number] Date of Birth: 1956/01/24 Referring Provider:  Marton Redwood, MD  Encounter Date: 01/19/2015      PT End of Session - 01/19/15 1840    Visit Number 14   Number of Visits 17   Date for PT Re-Evaluation 01/31/15   Authorization Type [employer]   PT Start Time 1024   PT Stop Time 1107   PT Time Calculation (min) 43 min   Activity Tolerance Patient tolerated treatment well   Behavior During Therapy [facility] for tasks assessed/performed      Past Medical History  Diagnosis Date  . Allergy     seasonal  . Depression   . Neuromuscular disorder     MS  . Thyroid disease     hypothyroid  . Multiple sclerosis     Past Surgical History  Procedure Laterality Date  . Cesarean section      x2  . Laparoscopic abdominal exploration      for endometriosis    There were no vitals filed for this visit.  Visit Diagnosis:  Abnormality of gait  Generalized weakness  Foot drop, left  Unsteadiness      Subjective Assessment - 01/19/15 1831    Subjective "I've been wearing my (left) AFO all the time, and my left ankle isn't hurting at all now." Pt denies falls.   Pertinent History Secondary progressive multiple sclerosis (MS diagnosed in 1988), hypothyroidism, depression   Limitations Walking;House hold activities   Patient Stated Goals "Anything that can help me walk easier and not get that leaning to one side."   Currently in Pain? No/denies                         [facility] Adult PT Treatment/Exercise - 01/19/15 0001    Ambulation/Gait   Ambulation/Gait Yes   Ambulation/Gait Assistance 5: Supervision;4: Min guard   Ambulation/Gait Assistance Details Cueing focused on abdominal bracing, activation of core musculature  (emphasis on transverse abdominis) which was effective in increasing trunk control during gait.   Ambulation Distance (Feet) 230 Feet   Assistive device Straight cane  L AFO   Gait Pattern Step-through pattern;Trendelenburg;Decreased hip/knee flexion - left;Trunk flexed;Left foot flat;Narrow base of support  L Trendelenberg   Ambulation Surface Level;Indoor   Exercises   Exercises Other Exercises   Other Exercises  Supine, hook lying, instructed pt in transverse abdominis activation with tactile/verbal cueing for proper technique. Educated pt on self-palpation of TA muscle. Progressed to TA activaton (abdominal bracing) x5-second holds, then to bracing with concurrent alternating BLE flexion. Noted pt difficulty maintaining TA activation with L hip flexion in supine, hook lying. Attempted bracing in seated position, then with ambulation. See gait trianing above for details.                PT Education - 01/19/15 1832    Education provided Yes   Education Details Core activation (focus on transverse abdominis).   Person(s) Educated Patient   Methods Explanation;Demonstration;Tactile cues;Verbal cues;Handout   Comprehension Verbalized understanding;Returned demonstration;Tactile cues required;Need further instruction          PT Short Term Goals - 01/04/15 1258    PT SHORT TERM GOAL #1   Title Pt will perform home exercises with mod I using paper handout to indicate safe  daily compliance with HEP. Target date: 12/30/14.   Baseline Met 12/13/14.   Status Achieved   PT SHORT TERM GOAL #2   Title Pt will demonstrate ability to self-correct posture with subtle cueing to indicate improved postural awareness. Target date: 12/30/14.   Status Achieved   PT SHORT TERM GOAL #3   Title Pt will increase Dynamic Gait Index score from 13/24 to 17/24 to indicate increased gait stability in presence of external demands. Target date: 12/30/14.   Baseline 12/30/14: DGI score = 18/124   Status  Achieved   PT SHORT TERM GOAL #4   Title Pt will negotiate 2 stairs with single L rail and mod I, increased time to enable pt to use all home entrances. Target date: 12/30/14.   Baseline Met 8/16.   Status Achieved           PT Long Term Goals - 01/17/15 2019    PT LONG TERM GOAL #1   Title Pt will increase self-selected gait speed from 2.94 ft/sec to 3.74 ft/sec to indicate increased efficiency of ambulation. Target date: 01/27/15.   Status On-going   PT LONG TERM GOAL #2   Title Pt will increase Dynamic Gait Index score from 13 to 20/24 to indicate decreased risk for falls. Target date: 01/27/15.   Status On-going   PT LONG TERM GOAL #3   Title Pt will ambulate 500' over unlevel, outdoor surfaces with mod I using LRAD to indicate increased stability/independence with community mobility. Target date: 01/27/15.   Status On-going   PT LONG TERM GOAL #4   Title Pt will independently negotiate curb step and outdoor inclined/declined surfaces with no overt LOB to indicate safety traversing community obstacles. Target date: 01/27/15.   Baseline Met 8/29.   Status Achieved   PT LONG TERM GOAL #5   Title Pt will increase Neuro QoL Lower Extremity score >/= 10 points from baseline to indicate improved quality of life related to LE function. Target date: 01/27/15.   Baseline NeuroQOL LE: 34.3   Status On-going               Plan - 01/19/15 1840    Clinical Impression Statement Session focused on initiating core muscular activation in supine/seated in attempt to increase trunk control during functional ambulation. Pt did appear to have less lateral trunk displacement during ambulation post-session as compared with pre-session. Will continue to reinforce/address. Continue per POC.   Pt will benefit from skilled therapeutic intervention in order to improve on the following deficits Abnormal gait;Decreased activity tolerance;Decreased balance;Decreased coordination;Decreased strength;Impaired  perceived functional ability;Improper body mechanics;Postural dysfunction;Decreased mobility;Decreased cognition   Rehab Potential Excellent   PT Frequency 2x / week   PT Duration 8 weeks   PT Treatment/Interventions ADLs/Self Care Home Management;Vestibular;Functional mobility training;Stair training;Gait training;Therapeutic activities;Therapeutic exercise;Balance training;Neuromuscular re-education;Patient/family education;Orthotic Fit/Training;Passive range of motion;Manual techniques;Energy conservation   PT Next Visit Plan Core muscle activation: add multifidi activation/strengthening exercises. Progress TA activation from supine > seated > standing; use mirror to increase pt awareness of lateral displacement of trunk, pelvic instability with functional movement   Consulted and Agree with Plan of Care Patient        Problem List There are no active problems to display for this patient.  Billie Ruddy, PT, [facility] [address] [place], [place], [address] Phone: [phone]   Fax:  [phone] 01/19/2015, 6:45 PM

## 2015-01-26 ENCOUNTER — Ambulatory Visit: Payer: 59 | Admitting: Physical Therapy

## 2015-01-28 ENCOUNTER — Ambulatory Visit: Payer: 59 | Attending: Internal Medicine | Admitting: Physical Therapy

## 2015-01-28 DIAGNOSIS — R531 Weakness: Secondary | ICD-10-CM

## 2015-01-28 DIAGNOSIS — R2681 Unsteadiness on feet: Secondary | ICD-10-CM | POA: Diagnosis present

## 2015-01-28 DIAGNOSIS — R269 Unspecified abnormalities of gait and mobility: Secondary | ICD-10-CM | POA: Diagnosis not present

## 2015-01-28 DIAGNOSIS — M21372 Foot drop, left foot: Secondary | ICD-10-CM

## 2015-01-29 NOTE — Therapy (Signed)
[facility] [address] [place], [place], [address] Phone: [phone]   Fax:  [phone]  Physical Therapy Treatment  Patient Details  Name: Amanda Li MRN: [id number] Date of Birth: Mar 05, 1956 Referring Provider:  Marton Redwood, MD  Encounter Date: 01/28/2015      PT End of Session - 01/29/15 1318    Visit Number 15   Number of Visits 17   Date for PT Re-Evaluation 01/31/15   Authorization Type [employer]   PT Start Time 1316   PT Stop Time 1400   PT Time Calculation (min) 44 min   Activity Tolerance Patient tolerated treatment well   Behavior During Therapy [facility] for tasks assessed/performed      Past Medical History  Diagnosis Date  . Allergy     seasonal  . Depression   . Neuromuscular disorder     MS  . Thyroid disease     hypothyroid  . Multiple sclerosis     Past Surgical History  Procedure Laterality Date  . Cesarean section      x2  . Laparoscopic abdominal exploration      for endometriosis    There were no vitals filed for this visit.  Visit Diagnosis:  Abnormality of gait  Generalized weakness  Foot drop, left  Unsteadiness      Subjective Assessment - 01/28/15 1320    Subjective Pt reports 3/10 pain in B ankles. Pt wearing L AFO for community distances. Pt denies falls, pain. When discussing upcoming end of original POC, pt requesting to "take the rest of September off" from PT due to busy schedule. Expressing concern that she isn't "as motivated as I was at first to do my home exercises."   Pertinent History Secondary progressive multiple sclerosis (MS diagnosed in 1988), hypothyroidism, depression   Patient Stated Goals "Anything that can help me walk easier and not get that leaning to one side."   Currently in Pain? Yes   Pain Score 3    Pain Location Ankle   Pain Orientation Right;Left   Pain Descriptors / Indicators Sharp   Pain Type Chronic pain   Pain  Onset In the past 7 days   Pain Frequency Intermittent   Aggravating Factors  "walking"   Pain Relieving Factors "Aleve"   Multiple Pain Sites No            [facility] PT Assessment - 01/29/15 0001    Observation/Other Assessments   Focus on Therapeutic Outcomes (FOTO)  Neuro QoL: 38.71                     [facility] Adult PT Treatment/Exercise - 01/29/15 0001    Ambulation/Gait   Ambulation/Gait Yes   Ambulation/Gait Assistance 6: Modified independent (Device/Increase time)   Ambulation Distance (Feet) 265 Feet  115', 2 x74'   Assistive device Straight cane;Other (Comment)  L AFO   Gait Pattern Step-through pattern;Trendelenburg;Decreased hip/knee flexion - left;Narrow base of support  improved upright posture this session   Ambulation Surface Level;Indoor   Gait velocity 3.07 ft/sec   Exercises   Exercises Other Exercises   Other Exercises  Supine, hook lying, pt performed TA activation 5 x 5-second holds with effective btween-session carryover of TA activation. Progressed to bracing with B LE elevation x10 reps then B reciprocal UE/LE flexion (to simulate gait pattern). Progressed to quadruped for increased proprioceptive input. With instruction, demonstration from this PT, pt performed quadruped single UE reaching (for increased core/proximal stability)  then progressed to B reciprocal UE flexion/LE extension with min A, multimodal cueing for stability with LLE extension 3 x 4 consecutive reps. Attempted transition from quadruped > tall kneeling; however, unable to maintain tall kneeling for > 10 seconds due to onset of cramp in L hamstring. Seated rest break required between supine and quadruped exercises.                PT Education - 01/29/15 1308    Education provided Yes   Education Details Discussed POC. On 9/12,tentatively plan to discontinue PT for 3 weeks. Also plan to reformat exercises to decrease number, increase pt ability to consistently perform.    Person(s) Educated Patient   Methods Explanation   Comprehension Verbalized understanding          PT Short Term Goals - 01/04/15 1258    PT SHORT TERM GOAL #1   Title Pt will perform home exercises with mod I using paper handout to indicate safe daily compliance with HEP. Target date: 12/30/14.   Baseline Met 12/13/14.   Status Achieved   PT SHORT TERM GOAL #2   Title Pt will demonstrate ability to self-correct posture with subtle cueing to indicate improved postural awareness. Target date: 12/30/14.   Status Achieved   PT SHORT TERM GOAL #3   Title Pt will increase Dynamic Gait Index score from 13/24 to 17/24 to indicate increased gait stability in presence of external demands. Target date: 12/30/14.   Baseline 12/30/14: DGI score = 18/124   Status Achieved   PT SHORT TERM GOAL #4   Title Pt will negotiate 2 stairs with single L rail and mod I, increased time to enable pt to use all home entrances. Target date: 12/30/14.   Baseline Met 8/16.   Status Achieved           PT Long Term Goals - 01/29/15 1323    PT LONG TERM GOAL #1   Title Pt will increase self-selected gait speed from 2.94 ft/sec to 3.74 ft/sec to indicate increased efficiency of ambulation. Target date: 01/27/15.   Baseline 9/9:  gait speed = 3.07 ft/sec   Status Not Met   PT LONG TERM GOAL #2   Title Pt will increase Dynamic Gait Index score from 13 to 20/24 to indicate decreased risk for falls. Target date: 01/27/15.   Status On-going   PT LONG TERM GOAL #3   Title Pt will ambulate 500' over unlevel, outdoor surfaces with mod I using LRAD to indicate increased stability/independence with community mobility. Target date: 01/27/15.   Status On-going   PT LONG TERM GOAL #4   Title Pt will independently negotiate curb step and outdoor inclined/declined surfaces with no overt LOB to indicate safety traversing community obstacles. Target date: 01/27/15.   Baseline Met 8/29.   Status Achieved   PT LONG TERM GOAL #5   Title  Pt will increase Neuro QoL Lower Extremity score >/= 10 points from baseline to indicate improved quality of life related to LE function. Target date: 01/27/15.   Baseline  NeuroQOL LE score at baseline: 34.3 %; on 9/10: 38.7%   Status Not Met               Plan - 01/29/15 1329    Clinical Impression Statement Skilled session continued to focus on increasing proximal stability/control increased stability and effieciency of ambulation. Pt reporting difficulty with HEP compliance at this time due to business of schedule. LTG's for self-selected gait speed and Neuro QoL  score both unmet, as pt exhibited improvement but not to goal-level. Per discussion with patient today, will tentatively plan to finish assessing LTG's and discontinue outpatient PT on last schduled session (9/12).    Pt will benefit from skilled therapeutic intervention in order to improve on the following deficits Abnormal gait;Decreased activity tolerance;Decreased balance;Decreased coordination;Decreased strength;Impaired perceived functional ability;Improper body mechanics;Postural dysfunction;Decreased mobility;Decreased cognition   Rehab Potential Excellent   PT Frequency 2x / week   PT Duration 8 weeks   PT Treatment/Interventions ADLs/Self Care Home Management;Vestibular;Functional mobility training;Stair training;Gait training;Therapeutic activities;Therapeutic exercise;Balance training;Neuromuscular re-education;Patient/family education;Orthotic Fit/Training;Passive range of motion;Manual techniques;Energy conservation   PT Next Visit Plan Finish checking LTG's and D/C. Provide more concise handout.   Consulted and Agree with Plan of Care Patient        Billie Ruddy, PT, DPT [facility] [address] [place], [place], [address] Phone: [phone]   Fax:  [phone] 01/29/2015, 1:37 PM

## 2015-01-31 ENCOUNTER — Ambulatory Visit: Payer: 59 | Admitting: Physical Therapy

## 2015-01-31 DIAGNOSIS — R269 Unspecified abnormalities of gait and mobility: Secondary | ICD-10-CM | POA: Diagnosis not present

## 2015-01-31 DIAGNOSIS — M21372 Foot drop, left foot: Secondary | ICD-10-CM

## 2015-01-31 DIAGNOSIS — R531 Weakness: Secondary | ICD-10-CM

## 2015-01-31 DIAGNOSIS — R2681 Unsteadiness on feet: Secondary | ICD-10-CM

## 2015-01-31 NOTE — Patient Instructions (Addendum)
Monday Wednesday, Friday   Abduction: Clam (Eccentric) - Side-Lying   Lie on side with knees bent. Lift top knee, keeping feet together. Keep trunk steady. Slowly lower for 3-5 seconds. 15 reps on the LEFT leg; 18 reps on the RIGHT leg.   Abductor Strength: Bridge Pose (Strap)   Place GREEN Theraband around thighs. Press outward against the green band; then, lift your hips up toward the ceiling. Hold 1-2 seconds then slowly lower. Perform 3 sets of 10 each day. To progress, add 2-3 reps per set.When you're able to perform 3 sets of 20 reps with the green band, switch to the BLUE band and start at 3 sets of 8 reps.    Gaze Stabilization: Tip Card 1.Target must remain in focus, not blurry, and appear stationary while head is in motion. 2.Perform exercises with small head movements (45 to either side of midline). 3.Increase speed of head motion so long as target is in focus. 4.If you wear eyeglasses, be sure you can see target through lens (therapist will give specific instructions for bifocal / progressive lenses). 5.These exercises may provoke dizziness or nausea. Work through these symptoms. If too dizzy, slow head movement slightly. Rest between each exercise. 6.Exercises demand concentration; avoid distractions. 7.For safety, perform standing exercises close to a counter, wall, corner, or next to someone.  Gaze Stabilization: Standing Feet Apart    Take off your progressive lenses. Standing with a stable chair in front of you (to hold onto, if needed), tape your visual target ("A") arm's length away from you. The "A" should be just below eye-level. While keeping eyes on target, tilt head down slightly and move head from right to left 60 seconds. Repeat while moving head up and down 20 times per direction. Do 2 sessions per day.   To progress this exercise, add 30 seconds to each trial (initially, 90 consecutive seconds) until you're able to consistently perform 2 minutes  consecutively.     Tuesday/Thursday  Weight Shift: Anterior / Posterior (Righting / Equilibrium)    Place a stable chair in front of you for safety. BEGIN WITH BACK LEANING AGAINST THE WALL AND FEET 4 INCHES AWAY. Imagine the wall behind you is made of glass; try not to break it. Slowly lift your SHOULDERS off the wall and come to upright standing. Hold for 3 seconds.  Return slowly to the wall letting your shoulders, then hips bump the wall and return to stand.  Hold each position __3__ seconds. Repeat _10__ times per session. Do _2_ sessions per day.    Terminal Knee Extension   Stand in front of a stable surface (kitchen countertop or stair railing). Anchor the GREEN theraband to the stable surface. With theraband just above LEFT knee. Squeeze buttocks and use thigh muscles to straighten knee. Do 15 repetitions, 2 times per day. Then, turn around 180 degrees and place a stable chair in front of you. Do the same exercise, but perform this 8 times. Do 3 sets per day. May use a mirror to help to control knee straightening. [email or web address]

## 2015-01-31 NOTE — Therapy (Addendum)
[employer] [address] [place], [place], [address] Phone: [phone]   Fax:  [phone]  Physical Therapy Treatment  Patient Details  Name: Amanda Li MRN: [id number] Date of Birth: August 26, 1955 Referring Provider:  Marton Redwood, MD  Encounter Date: 01/31/2015      PT End of Session - 01/31/15 1145    Visit Number 16   Number of Visits 17   Date for PT Re-Evaluation 01/31/15   Authorization Type [employer]   PT Start Time 0935   PT Stop Time 1022   PT Time Calculation (min) 47 min   Equipment Utilized During Treatment Gait belt   Activity Tolerance Patient tolerated treatment well   Behavior During Therapy [facility] for tasks assessed/performed      Past Medical History  Diagnosis Date  . Allergy     seasonal  . Depression   . Neuromuscular disorder     MS  . Thyroid disease     hypothyroid  . Multiple sclerosis     Past Surgical History  Procedure Laterality Date  . Cesarean section      x2  . Laparoscopic abdominal exploration      for endometriosis    There were no vitals filed for this visit.  Visit Diagnosis:  Abnormality of gait  Generalized weakness  Foot drop, left  Unsteadiness      Subjective Assessment - 01/31/15 1137    Subjective Pt reporting no pain at this time. Pt agreeable with discharge from PT today.   Pertinent History Secondary progressive multiple sclerosis (MS diagnosed in 1988), hypothyroidism, depression   Limitations Walking;House hold activities   Patient Stated Goals "Anything that can help me walk easier and not get that leaning to one side."   Currently in Pain? No/denies            [facility] PT Assessment - 01/31/15 0001    Dynamic Gait Index   Level Surface Mild Impairment   Change in Gait Speed Normal   Gait with Horizontal Head Turns Normal   Gait with Vertical Head Turns Mild Impairment  L toe catch when looking down   Gait and Pivot Turn  Mild Impairment   Step Over Obstacle Mild Impairment   Step Around Obstacles Normal   Steps Mild Impairment   Total Score 19                     [facility] Adult PT Treatment/Exercise - 01/31/15 0001    Ambulation/Gait   Ambulation/Gait Yes   Ambulation/Gait Assistance 5: Supervision;6: Modified independent (Device/Increase time)   Ambulation/Gait Assistance Details x500' outdoors with (S), x345' indoors with Mod I   Ambulation Distance (Feet) 845 Feet   Assistive device Straight cane;Other (Comment)  L AFO   Gait Pattern Step-through pattern;Trendelenburg;Decreased hip/knee flexion - left;Narrow base of support;Poor foot clearance - left  L toe catch outdoors only   Ambulation Surface Level;Unlevel;Indoor;Outdoor;Paved   Gait velocity 3.07 ft/sec   Exercises   Exercises Other Exercises   Other Exercises  Reviewed/progressed all home exercises, with exception of Terminal Knee Extension. See Pt Instructions for all details. Discussed how to safely progress each exercise. Reformatted HEP for pt to perform half of exercises on MWF, other half on other week days to increase pt compliance despite busy schedule, fatigue.Pt performed all home exercises safely/correctly (with exception of Termina Knee Extension and ankle strengthening HEP).  PT Education - 01/31/15 1139    Education provided Yes   Education Details Reformatted HEP to decrease number of exercises performed per day, increase pt compliance. Discussed safe progression of each exercise. Reviewed goals, progress, and discussed D/C plan.   Person(s) Educated Patient   Methods Explanation;Demonstration;Verbal cues;Handout   Comprehension Verbalized understanding;Returned demonstration          PT Short Term Goals - 01/04/15 1258    PT SHORT TERM GOAL #1   Title Pt will perform home exercises with mod I using paper handout to indicate safe daily compliance with HEP. Target date: 12/30/14.   Baseline  Met 12/13/14.   Status Achieved   PT SHORT TERM GOAL #2   Title Pt will demonstrate ability to self-correct posture with subtle cueing to indicate improved postural awareness. Target date: 12/30/14.   Status Achieved   PT SHORT TERM GOAL #3   Title Pt will increase Dynamic Gait Index score from 13/24 to 17/24 to indicate increased gait stability in presence of external demands. Target date: 12/30/14.   Baseline 12/30/14: DGI score = 18/124   Status Achieved   PT SHORT TERM GOAL #4   Title Pt will negotiate 2 stairs with single L rail and mod I, increased time to enable pt to use all home entrances. Target date: 12/30/14.   Baseline Met 8/16.   Status Achieved           PT Long Term Goals - 01/31/15 1146    PT LONG TERM GOAL #1   Title Pt will increase self-selected gait speed from 2.94 ft/sec to 3.74 ft/sec to indicate increased efficiency of ambulation. Target date: 01/27/15.   Baseline 9/9:  gait speed = 3.07 ft/sec   Status Not Met   PT LONG TERM GOAL #2   Title Pt will increase Dynamic Gait Index score from 13 to 20/24 to indicate decreased risk for falls. Target date: 01/27/15.   Baseline 9/12: DGI score = 19/24   Status Not Met   PT LONG TERM GOAL #3   Title Pt will ambulate 500' over unlevel, outdoor surfaces with mod I using LRAD to indicate increased stability/independence with community mobility. Target date: 01/27/15.   Baseline Mod I using RW. Supervision using SPC.   Status Achieved   PT LONG TERM GOAL #4   Title Pt will independently negotiate curb step and outdoor inclined/declined surfaces with no overt LOB to indicate safety traversing community obstacles. Target date: 01/27/15.   Baseline Met 8/29.   Status Achieved   PT LONG TERM GOAL #5   Title Pt will increase Neuro QoL Lower Extremity score >/= 10 points from baseline to indicate improved quality of life related to LE function. Target date: 01/27/15.   Baseline  NeuroQOL LE score at baseline: 34.3 %; on 9/10: 38.7%    Status Not Met               Plan - 01/31/15 1306    Clinical Impression Statement Pt discharged from outpatient PT at this time, as all goals have been addressed. 2 LTG's met and 3 LTG's not met. LTG's for self-selected gait speed, Neuro QoL LE score, and Dynamic Gait Index were all improved, but not to goal-level. Goals met for Mod I with community obstacle negotiation and commnity mobility; however, pt mod I for community ambulation only when using RW. Pt continues to require supervision when using SPC outdoors. Pt verbalized understanding and was in full agreement with D/C plan.  Pt will benefit from skilled therapeutic intervention in order to improve on the following deficits Abnormal gait;Decreased activity tolerance;Decreased balance;Decreased coordination;Decreased strength;Impaired perceived functional ability;Improper body mechanics;Postural dysfunction;Decreased mobility;Decreased cognition   PT Frequency 2x / week   PT Duration 8 weeks   PT Treatment/Interventions ADLs/Self Care Home Management;Vestibular;Functional mobility training;Stair training;Gait training;Therapeutic activities;Therapeutic exercise;Balance training;Neuromuscular re-education;Patient/family education;Orthotic Fit/Training;Passive range of motion;Manual techniques;Energy conservation   PT Next Visit Plan N/A, as pt discharged from outpatient PT at this time.   Consulted and Agree with Plan of Care Patient      PHYSICAL THERAPY DISCHARGE SUMMARY  Visits from Start of Care: 16  Current functional level related to goals / functional outcomes: See above goals and statuses.    Remaining deficits: Pt continues to require supervision when ambulating with SPC outdoors. Therefore, recommending RW for all outdoor ambulation. Pt also continues to exhibit decreased gait stability with vertical head turns and  proximal LE weakness (L > R).   Education / Equipment: Recommendation for RW for all outdoor/community  mobility, HEP and education for safe progression, information on available resources, L AFO Plan: Patient agrees to discharge.  Patient goals were partially met. Patient is being discharged due to being pleased with the current functional level.  ?????        Problem List There are no active problems to display for this patient.   Billie Ruddy, PT, [facility] [address] [place], [place], [address] Phone: [phone]   Fax:  [phone] 01/31/2015, 6:57 PM

## 2015-02-01 ENCOUNTER — Ambulatory Visit: Payer: 59 | Admitting: Physical Therapy

## 2015-02-07 ENCOUNTER — Other Ambulatory Visit: Payer: Self-pay | Admitting: Internal Medicine

## 2015-02-07 DIAGNOSIS — N6310 Unspecified lump in the right breast, unspecified quadrant: Secondary | ICD-10-CM

## 2015-02-09 ENCOUNTER — Other Ambulatory Visit: Payer: Self-pay | Admitting: Internal Medicine

## 2015-02-09 DIAGNOSIS — N6452 Nipple discharge: Secondary | ICD-10-CM

## 2015-02-15 ENCOUNTER — Ambulatory Visit
Admission: RE | Admit: 2015-02-15 | Discharge: 2015-02-15 | Disposition: A | Discharge status: Home / Self Care | Payer: 59 | Source: Ambulatory Visit | Attending: Internal Medicine | Admitting: Internal Medicine

## 2015-02-15 ENCOUNTER — Other Ambulatory Visit: Payer: Self-pay | Admitting: Internal Medicine

## 2015-02-15 DIAGNOSIS — N6452 Nipple discharge: Secondary | ICD-10-CM

## 2015-02-15 DIAGNOSIS — R921 Mammographic calcification found on diagnostic imaging of breast: Secondary | ICD-10-CM

## 2015-02-16 ENCOUNTER — Other Ambulatory Visit: Payer: Self-pay | Admitting: Internal Medicine

## 2015-02-16 DIAGNOSIS — N6452 Nipple discharge: Secondary | ICD-10-CM

## 2015-02-18 ENCOUNTER — Other Ambulatory Visit: Payer: Self-pay | Admitting: General Surgery

## 2015-02-18 DIAGNOSIS — D0511 Intraductal carcinoma in situ of right breast: Secondary | ICD-10-CM

## 2015-02-18 DIAGNOSIS — N6452 Nipple discharge: Secondary | ICD-10-CM

## 2015-02-19 DIAGNOSIS — M2550 Pain in unspecified joint: Secondary | ICD-10-CM

## 2015-02-19 HISTORY — DX: Pain in unspecified joint: M25.50

## 2015-02-22 ENCOUNTER — Telehealth: Payer: Self-pay | Admitting: *Deleted

## 2015-02-22 NOTE — Telephone Encounter (Signed)
Received referral from [facility].  Called pt and confirmed 02/28/15 appt w/ her.  Unable to mail packet - gave verbal, directions, instructions and placed a note for an intake form to be given to the pt at time of check in.  Emailed Nilda Simmer and [place] at [facility] to make them aware.  Emailed [facility] for pt tracking.  Placed a copy of the records in Dr. Ernestina Penna box and took one to HIM to scan.

## 2015-02-23 ENCOUNTER — Ambulatory Visit
Admission: RE | Admit: 2015-02-23 | Discharge: 2015-02-23 | Disposition: A | Discharge status: Home / Self Care | Payer: 59 | Source: Ambulatory Visit | Attending: General Surgery | Admitting: General Surgery

## 2015-02-23 MED ORDER — GADOBENATE DIMEGLUMINE 529 MG/ML IV SOLN
9.0000 mL | Freq: Once | INTRAVENOUS | Status: AC | PRN
  Administered 2015-02-23: 9 mL via INTRAVENOUS

## 2015-02-28 ENCOUNTER — Telehealth: Payer: Self-pay | Admitting: Hematology

## 2015-02-28 ENCOUNTER — Encounter: Payer: Self-pay | Admitting: Hematology

## 2015-02-28 ENCOUNTER — Ambulatory Visit (HOSPITAL_BASED_OUTPATIENT_CLINIC_OR_DEPARTMENT_OTHER): Payer: 59 | Admitting: Hematology

## 2015-02-28 ENCOUNTER — Encounter: Payer: Self-pay | Admitting: *Deleted

## 2015-02-28 VITALS — BP 148/89 | HR 111 | Temp 98.6°F | Resp 18 | Ht 62.0 in | Wt 120.4 lb

## 2015-02-28 DIAGNOSIS — D0511 Intraductal carcinoma in situ of right breast: Secondary | ICD-10-CM | POA: Diagnosis not present

## 2015-02-28 DIAGNOSIS — G35 Multiple sclerosis: Secondary | ICD-10-CM | POA: Diagnosis not present

## 2015-02-28 DIAGNOSIS — C50311 Malignant neoplasm of lower-inner quadrant of right female breast: Secondary | ICD-10-CM

## 2015-02-28 DIAGNOSIS — Z170 Estrogen receptor positive status [ER+]: Secondary | ICD-10-CM

## 2015-02-28 NOTE — Telephone Encounter (Signed)
Gave and printed appt sched and avs fo rpt; for NOV  °

## 2015-02-28 NOTE — Progress Notes (Signed)
[place]  [phone] [phone] [place] Note   Patient Care Team: Marton Redwood, MD as PCP - General (Internal Medicine) 02/28/2015  Referring physician: Dr. [name]  CHIEF COMPLAINTS/PURPOSE OF CONSULTATION:  Right breast DCIS  Oncology History   Breast cancer of lower-inner quadrant of right female breast [facility])   Staging form: Breast, AJCC 7th Edition     Clinical stage from 02/28/2015: Stage 0 (Tis (DCIS), N0, M0) - Signed by Truitt Merle, MD on 02/28/2015       Breast cancer of lower-inner quadrant of right female breast ([place])   02/15/2015 Receptors her2 Your 100% positive, PR 100% positive   02/15/2015 Initial Biopsy Right breast core needle biopsy at the 3:30 o'clock showed DCIS with calcifications, intermediate grade, intraductal papilloma.   02/15/2015 Mammogram Diagnostic breast mammogram and ultrasound showed a 3 mm group of slightly suspicious calcification located within the lower inner quadrant of the right breast.   02/23/2015 Imaging Breast MRI showed no abnormal enhancement in either breast.   02/28/2015 Initial Diagnosis Breast cancer of lower-inner quadrant of right female breast ([place])    HISTORY OF PRESENTING ILLNESS:  Amanda Li 59 y.o. female is here because of recently diagnosed right breast DCIS.  This was discovered by screening mammogram. She had had nipple discharge for 6 months, very small amount, initially clear, occationally bloody, no palpable breast mass or lumps. She otherwise denies any new symptoms. Her mammogram showed a small area of calcification, she underwent diagnostic mammogram and ultrasound and core needle biopsy of the right breast lesion, which showed intermediate grade DCIS, and intraductal papilloma.     She was diagnosed with MS in 1988, she follows up with her neurologist Dr. Caffie Damme at [facility]. she was initially on interferon, copaxane, and late on switched to  [place], which was stopped 5 years ago due to the ineffectiveness. She has had some disease progression for the past several month, especially in the past month. She has foot drop, not able to stand up and walk well, she walks with a crane, and she is very fatigue, she has mild tremor in left hand, and some urinary incontinyue, difficult to write, she is able to take care of her self, but takes time, she does not do much else at home. She denies any significant pain, appetite and by mouth intake are normal. No weight loss   MEDICAL HISTORY:  Past Medical History  Diagnosis Date  . Allergy     seasonal  . Depression   . Neuromuscular disorder ([place])     MS  . Thyroid disease     hypothyroid  . Multiple sclerosis ([place])     SURGICAL HISTORY: Past Surgical History  Procedure Laterality Date  . Cesarean section      x2  . Laparoscopic abdominal exploration      for endometriosis   GYN HISTORY  Menarchal: 12 LMP: 10 Contraceptive: none  HRT: 10 years, stopped on 02/15/2015  G2P2: breast feeding (+)    SOCIAL HISTORY: Social History   Social History  . Marital Status: Married    Spouse Name: N/A  . Number of Children: 66 son, 16 daughter   . Years of Education: N/A   Occupational History  . She was a Software engineer    Social History Main Topics  . Smoking status: Never Smoker   . Smokeless tobacco: Never Used  . Alcohol Use: 4.2 oz/week    7 Standard drinks  or equivalent per week     Comment: 1 glass of wine a day  . Drug Use: No  . Sexual Activity: Not on file   Other Topics Concern  . Not on file   Social History Narrative    FAMILY HISTORY: Family History  Problem Relation Age of Onset  . Colon cancer Neg Hx   . Rectal cancer Neg Hx   . Stomach cancer Neg Hx   . Heart disease Mother   . Cancer Father 27    lung cancer     ALLERGIES:  is allergic to penicillins.  MEDICATIONS:  Current Outpatient Prescriptions  Medication Sig Dispense Refill  . baclofen  (LIORESAL) 20 MG tablet Take 20 mg by mouth.    Marland Kitchen buPROPion (WELLBUTRIN XL) 150 MG 24 hr tablet Take 1 tablet by mouth daily.  6  . calcium elemental as carbonate (PX ANTACID MAXIMUM STRENGTH) 400 MG tablet Chew 1 tablet by mouth.    . Cholecalciferol (PA VITAMIN D-3) 2000 UNITS CAPS Take by mouth.    . DULoxetine (CYMBALTA) 60 MG capsule Take 60 mg by mouth.    . fluticasone (FLONASE) 50 MCG/ACT nasal spray Place 50 mcg into the nose.    . levothyroxine (SYNTHROID, LEVOTHROID) 100 MCG tablet Take 100 mcg by mouth.    . mirabegron ER (MYRBETRIQ) 25 MG TB24 tablet Take 25 mg by mouth.     No current facility-administered medications for this visit.    REVIEW OF SYSTEMS:   Constitutional: Denies fevers, chills or abnormal night sweats Eyes: Denies blurriness of vision, double vision or watery eyes Ears, nose, mouth, throat, and face: Denies mucositis or sore throat Respiratory: Denies cough, dyspnea or wheezes Cardiovascular: Denies palpitation, chest discomfort or lower extremity swelling Gastrointestinal:  Denies nausea, heartburn or change in bowel habits Skin: Denies abnormal skin rashes Lymphatics: Denies new lymphadenopathy or easy bruising Neurological:Denies numbness, tingling or new weaknesses Behavioral/Psych: Mood is stable, no new changes  All other systems were reviewed with the patient and are negative.  PHYSICAL EXAMINATION: ECOG PERFORMANCE STATUS: 3 - Symptomatic, >50% confined to bed  Filed Vitals:   02/28/15 1120  BP: 148/89  Pulse: 111  Temp: 98.6 F (37 C)  Resp: 18   Filed Weights   02/28/15 1120  Weight: 120 lb 6.4 oz (54.613 kg)    GENERAL:alert, no distress and comfortable SKIN: skin color, texture, turgor are normal, no rashes or significant lesions EYES: normal, conjunctiva are pink and non-injected, sclera clear OROPHARYNX:no exudate, no erythema and lips, buccal mucosa, and tongue normal  NECK: supple, thyroid normal size, non-tender, without  nodularity LYMPH:  no palpable lymphadenopathy in the cervical, axillary or inguinal LUNGS: clear to auscultation and percussion with normal breathing effort HEART: regular rate & rhythm and no murmurs and no lower extremity edema ABDOMEN:abdomen soft, non-tender and normal bowel sounds Musculoskeletal:no cyanosis of digits and no clubbing  PSYCH: alert & oriented x 3 with fluent speech NEURO: no focal motor/sensory deficits Breasts: Breast inspection showed them to be symmetrical with no nipple discharge. (+) Skin ecchymosis and a 2 cm lump at the biopsy site. Palpation of the left breasts and axilla revealed no obvious mass that I could appreciate.   LABORATORY DATA:  I have reviewed the data as listed No results found for: WBC, HGB, HCT, MCV, PLT No results for input(s): NA, K, CL, CO2, GLUCOSE, BUN, CREATININE, CALCIUM, GFRNONAA, GFRAA, PROT, ALBUMIN, AST, ALT, ALKPHOS, BILITOT, BILIDIR, IBILI in the last 8760 hours.  PATHOLOGY REPORT Diagnosis 02/15/2015 Breast, right, needle core biopsy, inner quadrant 3:30 o'clock - DUCTAL CARCINOMA IN SITU WITH CALCIFICATIONS. - INTRADUCTAL PAPILLOMA.D - SEE COMMENT. Microscopic Comment The carcinoma appears intermediate grade. Immunohistochemical stains for p63, smooth muscle myosin, and calponin highlight the presence of myoepithelial cells around the in situ carcinoma. Estrogen receptor and progesterone receptor studies will be performed and the results reported separately. The results were called to [facility] on 02/17/15. (JBK:ds:ecj 02/17/15)  Results: IMMUNOHISTOCHEMICAL AND MORPHOMETRIC ANALYSIS PERFORMED MANUALLY Estrogen Receptor: 100%, POSITIVE, STRONG STAINING INTENSITY Progesterone Receptor: 100%, POSITIVE, STRONG STAINING INTENSITY  RADIOGRAPHIC STUDIES: I have personally reviewed the radiological images as listed and agreed with the findings in the report.  Mr Breast Bilateral W Wo Contrast  02/23/2015    CLINICAL DATA:  Biopsy proven intermediate grade ductal carcinoma in-situ with calcifications and intraductal papilloma in the right breast.  LABS:  None obtained at the time of imaging.  EXAM: BILATERAL BREAST MRI WITH AND WITHOUT CONTRAST  TECHNIQUE: Multiplanar, multisequence MR images of both breasts were obtained prior to and following the intravenous administration of 9 ml of MultiHance.  THREE-DIMENSIONAL MR IMAGE RENDERING ON INDEPENDENT WORKSTATION:  Three-dimensional MR images were rendered by post-processing of the original MR data on an independent workstation. The three-dimensional MR images were interpreted, and findings are reported in the following complete MRI report for this study. Three dimensional images were evaluated at the independent DynaCad workstation  COMPARISON:  Previous exam(s).  FINDINGS: Breast composition: c. Heterogeneous fibroglandular tissue.  Background parenchymal enhancement: Moderate.  Right breast: No mass or abnormal enhancement. Biopsy changes with a hematoma are seen in the medial aspect of the right breast. Signal void artifact is seen in the medial aspect of the right breast from the biopsy clip.  Left breast: No mass or abnormal enhancement.  Lymph nodes: No abnormal appearing lymph nodes.  Ancillary findings:  None.  IMPRESSION: No abnormal enhancement in either breast.  RECOMMENDATION: Treatment planning of the known right breast ductal carcinoma in-situ and intraductal papilloma is recommended.  BI-RADS CATEGORY  6: Known biopsy-proven malignancy.   Electronically Signed   By: Lillia Mountain M.D.   On: 02/23/2015 13:36   Mm Digital Diagnostic Unilat R  02/15/2015   CLINICAL DATA:  Post right breast stereotactic biopsy.  EXAM: DIAGNOSTIC RIGHT MAMMOGRAM POST STEREOTACTIC BIOPSY  COMPARISON:  Previous exam(s).  FINDINGS: Mammographic images were obtained following stereotactic guided biopsy of the small group of calcifications located within the lower inner quadrant of  the right breast at the 3:30 o'clock position. The X shaped clip is in appropriate position. There is a small post biopsy hematoma present.  IMPRESSION: Appropriate position of clip following right breast stereotactic biopsy.  Final Assessment: Post Procedure Mammograms for Marker Placement   Electronically Signed   By: Altamese Cabal M.D.   On: 02/15/2015 16:14   Mm Ductogram Unilateral Right  02/15/2015   CLINICAL DATA:  Right breast single duct spontaneous nipple discharge.  EXAM: DUCTOGRAM RIGHT BREAST  FINDINGS: On physical exam, single duct, amber nipple discharge could be expressed from a duct within the central portion of the right nipple.  Using standard ductogram catheter and sterile technique, attempts were made to cannulate the duct associated with spontaneous discharge. However, the duct could not be cannulated. As a result, I recommend repeat attempt at ductography in 2-3 weeks. The patient has been asked to avoid expressing nipple discharge in the interim.  IMPRESSION: Unsuccessful initial attempt at  right breast ductography.  RECOMMENDATION: Repeat attempt at right breast ductography in 2- 3 weeks.  0: Incomplete. Need additional imaging evaluation and/or prior mammograms for comparison.   Electronically Signed   By: Altamese Cabal M.D.   On: 02/15/2015 16:12   US Breast Ltd Uni Left Inc Axilla  02/15/2015   CLINICAL DATA:  Patient states that she has noted right breast nipple. The patient states that she has noticed right breast nipple discharge for approximately 1 year. This may be spontaneous at times and is also with expression. She has no left breast nipple discharge.  EXAM: DIGITAL DIAGNOSTIC BILATERAL MAMMOGRAM WITH 3D TOMOSYNTHESIS WITH CAD  ULTRASOUND BILATERAL BREAST  COMPARISON:  02/17/2014.  ACR Breast Density Category c: The breast tissue is heterogeneously dense, which may obscure small masses.  FINDINGS: There is a small group of heterogeneous calcifications located within  the lower inner quadrant of the right breast at approximately the 3;30 o'clock location. These span 3 mm. There is no evidence for a subareolar right breast mass. There is a multinodular breast parenchymal pattern present bilaterally. There is no distortion.  Mammographic images were processed with CAD.  On physical exam, single duct, amber, nipple discharge could be expressed from a duct within the central portion of the right nipple. There was no palpable subareolar breast mass bilaterally.  Targeted ultrasound is performed, showing no evidence for an intraductal mass or subareolar breast mass within the right subareolar region or within the left subareolar region.  IMPRESSION: 1. For 3 mm group of slightly suspicious calcifications located within the lower inner quadrant of the right breast. Stereotactic biopsy of these calcifications is recommended and will be scheduled. 2. Spontaneous single duct right breast nipple discharge. Right breast ductography is recommended.  RECOMMENDATION: 1. Right breast ductography. 2. Right breast stereotactic biopsy as discussed above.  I have discussed the findings and recommendations with the patient. Results were also provided in writing at the conclusion of the visit. If applicable, a reminder letter will be sent to the patient regarding the next appointment.  BI-RADS CATEGORY  4: Suspicious.   Electronically Signed   By: Altamese Cabal M.D.   On: 02/15/2015 16:10   US Breast Ltd Uni Right Inc Axilla  02/15/2015   CLINICAL DATA:  Patient states that she has noted right breast nipple. The patient states that she has noticed right breast nipple discharge for approximately 1 year. This may be spontaneous at times and is also with expression. She has no left breast nipple discharge.  EXAM: DIGITAL DIAGNOSTIC BILATERAL MAMMOGRAM WITH 3D TOMOSYNTHESIS WITH CAD  ULTRASOUND BILATERAL BREAST  COMPARISON:  02/17/2014.  ACR Breast Density Category c: The breast tissue is  heterogeneously dense, which may obscure small masses.  FINDINGS: There is a small group of heterogeneous calcifications located within the lower inner quadrant of the right breast at approximately the 3;30 o'clock location. These span 3 mm. There is no evidence for a subareolar right breast mass. There is a multinodular breast parenchymal pattern present bilaterally. There is no distortion.  Mammographic images were processed with CAD.  On physical exam, single duct, amber, nipple discharge could be expressed from a duct within the central portion of the right nipple. There was no palpable subareolar breast mass bilaterally.  Targeted ultrasound is performed, showing no evidence for an intraductal mass or subareolar breast mass within the right subareolar region or within the left subareolar region.  IMPRESSION: 1. For 3 mm group of slightly suspicious calcifications located  within the lower inner quadrant of the right breast. Stereotactic biopsy of these calcifications is recommended and will be scheduled. 2. Spontaneous single duct right breast nipple discharge. Right breast ductography is recommended.  RECOMMENDATION: 1. Right breast ductography. 2. Right breast stereotactic biopsy as discussed above.  I have discussed the findings and recommendations with the patient. Results were also provided in writing at the conclusion of the visit. If applicable, a reminder letter will be sent to the patient regarding the next appointment.  BI-RADS CATEGORY  4: Suspicious.   Electronically Signed   By: Altamese Cabal M.D.   On: 02/15/2015 16:10   Mm Diag Breast Tomo Bilateral  02/15/2015   CLINICAL DATA:  Patient states that she has noted right breast nipple. The patient states that she has noticed right breast nipple discharge for approximately 1 year. This may be spontaneous at times and is also with expression. She has no left breast nipple discharge.  EXAM: DIGITAL DIAGNOSTIC BILATERAL MAMMOGRAM WITH 3D  TOMOSYNTHESIS WITH CAD  ULTRASOUND BILATERAL BREAST  COMPARISON:  02/17/2014.  ACR Breast Density Category c: The breast tissue is heterogeneously dense, which may obscure small masses.  FINDINGS: There is a small group of heterogeneous calcifications located within the lower inner quadrant of the right breast at approximately the 3;30 o'clock location. These span 3 mm. There is no evidence for a subareolar right breast mass. There is a multinodular breast parenchymal pattern present bilaterally. There is no distortion.  Mammographic images were processed with CAD.  On physical exam, single duct, amber, nipple discharge could be expressed from a duct within the central portion of the right nipple. There was no palpable subareolar breast mass bilaterally.  Targeted ultrasound is performed, showing no evidence for an intraductal mass or subareolar breast mass within the right subareolar region or within the left subareolar region.  IMPRESSION: 1. For 3 mm group of slightly suspicious calcifications located within the lower inner quadrant of the right breast. Stereotactic biopsy of these calcifications is recommended and will be scheduled. 2. Spontaneous single duct right breast nipple discharge. Right breast ductography is recommended.  RECOMMENDATION: 1. Right breast ductography. 2. Right breast stereotactic biopsy as discussed above.  I have discussed the findings and recommendations with the patient. Results were also provided in writing at the conclusion of the visit. If applicable, a reminder letter will be sent to the patient regarding the next appointment.  BI-RADS CATEGORY  4: Suspicious.   Electronically Signed   By: Altamese Cabal M.D.   On: 02/15/2015 16:10   Mm Rt Breast Bx W Loc Dev 1st Lesion Image Bx Spec Stereo Guide  02/22/2015   ADDENDUM REPORT: 02/17/2015 11:53 ADDENDUM: Pathology revealed intermediate grade ductal carcinoma in situ with calcifications and an intraductal papilloma in the right  breast. This was found to be concordant by Dr. Luberta Robertson. Pathology was discussed with the patient by telephone. She reported doing well after the biopsy. Post biopsy instructions and care were reviewed and her questions were answered. Surgical consultation has been scheduled with Dr. Autumn Messing at [facility] on February 18, 2015. Ductography was attempted and was unsuccessful, but if it would assist in treatment decisions, it could be re-attempted. Bilateral breast MRI is recommended for this patient. My number is provided for future questions and concerns. Pathology results reported by Susa Raring RN, BSN on February 17, 2015. Electronically Signed   By: Altamese Cabal M.D.   On: 02/17/2015 11:53  02/22/2015  CLINICAL DATA:  Indeterminate right breast calcifications.  EXAM: RIGHT BREAST STEREOTACTIC CORE NEEDLE BIOPSY  COMPARISON:  Previous exams.  FINDINGS: The patient and I discussed the procedure of stereotactic-guided biopsy including benefits and alternatives. We discussed the high likelihood of a successful procedure. We discussed the risks of the procedure including infection, bleeding, tissue injury, clip migration, and inadequate sampling. Informed written consent was given. The usual time out protocol was performed immediately prior to the procedure.  Using sterile technique and 2% lidocaine as local anesthetic, under stereotactic guidance, a 9 gauge vacuum assisted device was used to perform core needle biopsy of calcifications within the lower inner quadrant of the right breast using a medial approach. Specimen radiograph was performed showing representative calcifications within the specimen. Specimens with calcifications are identified for pathology.  At the conclusion of the procedure, an X shaped tissue marker clip was deployed into the biopsy cavity. Follow-up 2-view mammogram was performed and dictated separately.  IMPRESSION: Stereotactic-guided biopsy of  right breast calcifications as discussed above. No apparent complications.  Electronically Signed: By: Altamese Cabal M.D. On: 02/15/2015 16:13    ASSESSMENT & PLAN: 59 year old Caucasian female, with past medical history of multiple sclerosis,   1. Right breast DCIS, ER+/PR+ -I discussed her mammogram, breast MI, and a core needle biopsy results in details. -We discussed that the size is noninvasive, and he will be cured by complete surgical resection -Any form of adjuvant therapy is to prevent future rest cancer. She is at high risk of developing second breast cancer in the future. -We discussed chemoprevention with tamoxifen or anastrozole. Given her significant and worsening MS, I do not think she can tolerate anastrozole due to side effects, so saint joints. We reviewed the benefit and risks of tamoxifen, which includes but not limited to, hot flash, skin or vaginal dryness, thrombosis, endometrial cancer, slightly increased risk of cardiovascularl disease, etc. Tamoxifen probably will decrease her risk of breast cancer by 40%, however there is no data to support survival benefit.  -Again, giving her severe MS, my recommendation of tamoxifen for breast cancer prevention is moderate to low. Of course, if she is highly motivated, and very concerned about risk of second breast cancer, it is reasonable to try. We discussed the duration of therapy will be 5 years. -We also discussed that needle biopsy they have sampling error, we'll review her final surgical path to ruled out invasive cancer. -We discussed breast cancer screening, which includes annual diagnostic mammogram, self breast exam, breast exam by a physician every 6 months. -I encouraged her to have healthy diet, and try to be as physically active as she can. -I encouraged her to take calcium and vitamin D for bone health. -She is scheduled to see radiation oncologist Dr. Valere Dross to discuss breast irradiation.  2. Right breast  intraductal papilloma  -This will be removed along with the DCIS.  3. MS -She will continue follow-up with her neurologist -If he decides to take tamoxifen after surgery, I'll check with her neurologist Dr. Caffie Damme   Follow up: I'll see her back in 2-3 weeks after her breast surgery, to review her surgical past and finalize her antiestrogen therapy.   All questions were answered. The patient knows to call the clinic with any problems, questions or concerns. I spent 55 minutes counseling the patient face to face. The total time spent in the appointment was 60 minutes and more than 50% was on counseling.     Truitt Merle, MD 02/28/2015 4:00 PM

## 2015-03-02 ENCOUNTER — Ambulatory Visit
Admission: RE | Admit: 2015-03-02 | Discharge: 2015-03-02 | Disposition: A | Discharge status: Home / Self Care | Payer: 59 | Source: Ambulatory Visit | Attending: Radiation Oncology | Admitting: Radiation Oncology

## 2015-03-02 ENCOUNTER — Encounter: Payer: Self-pay | Admitting: Radiation Oncology

## 2015-03-02 VITALS — BP 119/77 | HR 68 | Temp 98.3°F | Ht 62.0 in | Wt 121.0 lb

## 2015-03-02 DIAGNOSIS — E079 Disorder of thyroid, unspecified: Secondary | ICD-10-CM | POA: Insufficient documentation

## 2015-03-02 DIAGNOSIS — C50311 Malignant neoplasm of lower-inner quadrant of right female breast: Secondary | ICD-10-CM | POA: Diagnosis present

## 2015-03-02 DIAGNOSIS — Z510 Encounter for antineoplastic radiation therapy: Secondary | ICD-10-CM | POA: Diagnosis not present

## 2015-03-02 DIAGNOSIS — F1010 Alcohol abuse, uncomplicated: Secondary | ICD-10-CM | POA: Diagnosis not present

## 2015-03-02 DIAGNOSIS — F329 Major depressive disorder, single episode, unspecified: Secondary | ICD-10-CM | POA: Diagnosis not present

## 2015-03-02 DIAGNOSIS — Z170 Estrogen receptor positive status [ER+]: Secondary | ICD-10-CM | POA: Insufficient documentation

## 2015-03-02 DIAGNOSIS — G35 Multiple sclerosis: Secondary | ICD-10-CM | POA: Diagnosis not present

## 2015-03-02 DIAGNOSIS — G709 Myoneural disorder, unspecified: Secondary | ICD-10-CM | POA: Insufficient documentation

## 2015-03-02 DIAGNOSIS — Z801 Family history of malignant neoplasm of trachea, bronchus and lung: Secondary | ICD-10-CM | POA: Diagnosis not present

## 2015-03-02 NOTE — Progress Notes (Signed)
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 female who is seen today through the courtesy of Dr. [name] for evaluation of her DCIS of the right breast.  At the time of a screening mammogram on 02/15/2015 she was found to have a 3 mm area of calcifications at the 3:30 o'clock location.  Because of a history of right nipple discharge a ductogram was attempted but this was unsuccessful on September 27.  This is to be rescheduled.  On 02/15/2015 she underwent a stereotactic biopsy of her right breast calcifications with the diagnosis of DCIS with calcifications along with intraductal papilloma.  The DCIS was felt to be intermediate grade and was ER positive and PR positive, both at 100%.  Breast MR on 02/23/2015 was without abnormal enhancement in either breast.  A hematoma was seen within the right breast, medial aspect.  She is awaiting surgical scheduling with Dr. [name].  PREVIOUS RADIATION THERAPY: No   PAST MEDICAL HISTORY:  has a past medical history of Allergy; Depression; Neuromuscular disorder ([place]); Thyroid disease; and Multiple sclerosis ([place]).     PAST SURGICAL HISTORY:  Past Surgical History  Procedure Laterality Date  . Cesarean section      x2  . Laparoscopic abdominal exploration      for endometriosis     FAMILY HISTORY: family history includes Cancer (age of onset: 59) in her father; Heart disease in her mother. There is no history of Colon cancer, Rectal cancer, or Stomach cancer.  Her father died of lung cancer at 59.  He was a smoker.  Her mother is alive at 59 and living in a retirement home.   SOCIAL HISTORY:  reports that she has never  smoked. She has never used smokeless tobacco. She reports that she drinks about 4.8 - 5.4 oz of alcohol per week. She reports that she does not use illicit drugs.  Married, 2 children.  Retired Software engineer.   ALLERGIES: Penicillins   MEDICATIONS:  Current Outpatient Prescriptions  Medication Sig Dispense Refill  . baclofen (LIORESAL) 20 MG tablet Take 20 mg by mouth 3 (three) times daily.     Marland Kitchen buPROPion (WELLBUTRIN XL) 150 MG 24 hr tablet Take 1 tablet by mouth daily.  6  . calcium elemental as carbonate (PX ANTACID MAXIMUM STRENGTH) 400 MG tablet Chew 1 tablet by mouth 2 (two) times daily.     . Cholecalciferol (PA VITAMIN D-3) 2000 UNITS CAPS Take by mouth daily.     . diazepam (VALIUM) 5 MG tablet Take 5 mg by mouth at bedtime as needed for anxiety.    . DULoxetine (CYMBALTA) 60 MG capsule Take 60 mg by mouth daily.     . fluticasone (FLONASE) 50 MCG/ACT nasal spray Place 50 mcg into the nose as needed.     Marland Kitchen levothyroxine (SYNTHROID, LEVOTHROID) 100 MCG tablet Take 100 mcg by mouth daily.     . mirabegron ER (MYRBETRIQ) 25 MG TB24 tablet Take 25 mg by mouth daily.      No current facility-administered medications for this encounter.     REVIEW  OF SYSTEMS:  Pertinent items are noted in HPI.    PHYSICAL EXAM:  height is 5\' 2"  (1.575 m) and weight is 121 lb (54.885 kg). Her temperature is 98.3 F (36.8 C). Her blood pressure is 119/77 and her pulse is 68.   Alert and oriented 59 year old white female appearing her stated age.  Nodes: There is no palpable cervical, supraclavicular, or axillary lymphadenopathy.  Breasts: She is small breasted.  There is a punctate biopsy wound at approximately 3:00 with a 2 cm hematoma that 2:30 to 3:00 along the medial aspect the right breast.  Left breast without masses or lesions.  Extremities: Without edema.   LABORATORY DATA:  No results found for: WBC, HGB, HCT, MCV, PLT No results found for: NA, K, CL, CO2 No results found for: ALT, AST, GGT,  ALKPHOS, BILITOT    IMPRESSION: Stage 0 (Tis N0 M0) intermediate grade DCIS of the right breast.  We discussed management options which include mastectomy versus partial mastectomy along with consideration of postoperative radiation therapy depending on her risk for recurrent disease based on her pathologic findings.  We discussed the potential acute and late toxicities of radiation therapy.  She would certainly be a candidate for hypofractionated radiation therapy over approximately 3 weeks.  Her prognosis is excellent.  I can see her back for a follow-up visit after definitive surgery.   PLAN: As discussed above.  I spent 45  minutes face to face with the patient and more than 50% of that time was spent in counseling and/or coordination of care.

## 2015-03-02 NOTE — Progress Notes (Signed)
Location of Breast Cancer: Right Breast  Histology per Pathology Report:   02/15/2015 Diagnosis Breast, right, needle core biopsy, inner quadrant 3:30 o'clock - DUCTAL CARCINOMA IN SITU WITH CALCIFICATIONS. - INTRADUCTAL PAPILLOMA.D Receptor Status: ER(100%), PR (100%), Her2-neu ()  Did patient present with symptoms (if so, please note symptoms) or was this found on screening mammography?: This was discovered by screening mammogram. She had had nipple discharge for 6 months, very small amount, initially clear, occationally bloody, no palpable breast mass or lumps. She otherwise denies any new symptoms.  Past/Anticipated interventions by surgeon, if any: Right needle core biopsy, inner quadrant 3:30 o'clock.  Past/Anticipated interventions by medical oncology, if any: Per Dr. Burr Medico: I'll see her back in 2-3 weeks after her breast surgery, to review her surgical past and finalize her antiestrogen therapy.  Lymphedema issues, if any: NO  Pain issues, if any: 5 presently  SAFETY ISSUES:  Prior radiation? NO  Pacemaker/ICD? No  Possible current pregnancy? No  Is the patient on methotrexate? No  Current Complaints / other details: GYN HISTORY  Menarchal: 12 LMP: 10 Contraceptive: none  HRT: 10 years, stopped on 02/15/2015  G2P2: breast feeding (+)       Amanda Li, Amanda Police, RN 03/02/2015,12:05 PM

## 2015-03-02 NOTE — Addendum Note (Signed)
Encounter addended by: Benn Moulder, RN on: 03/02/2015  3:35 PM<BR>     Documentation filed: Charges VN

## 2015-03-04 ENCOUNTER — Telehealth: Payer: Self-pay | Admitting: *Deleted

## 2015-03-04 NOTE — Telephone Encounter (Signed)
Left vm for pt to return call regarding navigation resources. Contact information provided.

## 2015-03-07 ENCOUNTER — Other Ambulatory Visit: Payer: Self-pay | Admitting: General Surgery

## 2015-03-07 DIAGNOSIS — D0511 Intraductal carcinoma in situ of right breast: Secondary | ICD-10-CM

## 2015-03-09 ENCOUNTER — Other Ambulatory Visit: Payer: Self-pay | Admitting: General Surgery

## 2015-03-09 DIAGNOSIS — D0511 Intraductal carcinoma in situ of right breast: Secondary | ICD-10-CM

## 2015-03-18 ENCOUNTER — Encounter (HOSPITAL_BASED_OUTPATIENT_CLINIC_OR_DEPARTMENT_OTHER): Payer: Self-pay | Admitting: *Deleted

## 2015-03-21 ENCOUNTER — Ambulatory Visit
Admission: RE | Admit: 2015-03-21 | Discharge: 2015-03-21 | Disposition: A | Discharge status: Home / Self Care | Payer: 59 | Source: Ambulatory Visit | Attending: General Surgery | Admitting: General Surgery

## 2015-03-21 DIAGNOSIS — D0511 Intraductal carcinoma in situ of right breast: Secondary | ICD-10-CM

## 2015-03-22 HISTORY — PX: BREAST LUMPECTOMY: SHX2

## 2015-03-25 ENCOUNTER — Encounter (HOSPITAL_BASED_OUTPATIENT_CLINIC_OR_DEPARTMENT_OTHER)
Admission: RE | Disposition: A | Discharge status: Home / Self Care | Payer: Self-pay | Source: Ambulatory Visit | Attending: General Surgery

## 2015-03-25 ENCOUNTER — Ambulatory Visit (HOSPITAL_BASED_OUTPATIENT_CLINIC_OR_DEPARTMENT_OTHER): Payer: 59 | Admitting: Anesthesiology

## 2015-03-25 ENCOUNTER — Ambulatory Visit (HOSPITAL_BASED_OUTPATIENT_CLINIC_OR_DEPARTMENT_OTHER)
Admission: RE | Admit: 2015-03-25 | Discharge: 2015-03-25 | Disposition: A | Discharge status: Home / Self Care | Payer: 59 | Source: Ambulatory Visit | Attending: General Surgery | Admitting: General Surgery

## 2015-03-25 ENCOUNTER — Encounter (HOSPITAL_BASED_OUTPATIENT_CLINIC_OR_DEPARTMENT_OTHER): Payer: Self-pay | Admitting: Anesthesiology

## 2015-03-25 ENCOUNTER — Ambulatory Visit
Admission: RE | Admit: 2015-03-25 | Discharge: 2015-03-25 | Disposition: A | Discharge status: Home / Self Care | Payer: 59 | Source: Ambulatory Visit | Attending: General Surgery | Admitting: General Surgery

## 2015-03-25 DIAGNOSIS — Z888 Allergy status to other drugs, medicaments and biological substances status: Secondary | ICD-10-CM | POA: Diagnosis not present

## 2015-03-25 DIAGNOSIS — D0511 Intraductal carcinoma in situ of right breast: Secondary | ICD-10-CM

## 2015-03-25 DIAGNOSIS — E039 Hypothyroidism, unspecified: Secondary | ICD-10-CM | POA: Diagnosis not present

## 2015-03-25 DIAGNOSIS — Z880 Allergy status to penicillin: Secondary | ICD-10-CM | POA: Diagnosis not present

## 2015-03-25 DIAGNOSIS — N641 Fat necrosis of breast: Secondary | ICD-10-CM | POA: Insufficient documentation

## 2015-03-25 DIAGNOSIS — N61 Mastitis without abscess: Secondary | ICD-10-CM | POA: Insufficient documentation

## 2015-03-25 DIAGNOSIS — D241 Benign neoplasm of right breast: Secondary | ICD-10-CM | POA: Diagnosis not present

## 2015-03-25 HISTORY — DX: Hypothyroidism, unspecified: E03.9

## 2015-03-25 HISTORY — DX: Dental restoration status: Z98.811

## 2015-03-25 HISTORY — DX: Frequency of micturition: R35.0

## 2015-03-25 HISTORY — PX: BREAST LUMPECTOMY WITH RADIOACTIVE SEED LOCALIZATION: SHX6424

## 2015-03-25 HISTORY — DX: Pain in unspecified joint: M25.50

## 2015-03-25 SURGERY — BREAST LUMPECTOMY WITH RADIOACTIVE SEED LOCALIZATION
Anesthesia: General | Site: Breast | Laterality: Right

## 2015-03-25 MED ORDER — LACTATED RINGERS IV SOLN
INTRAVENOUS | Status: DC
  Administered 2015-03-25: 11:00:00 via INTRAVENOUS

## 2015-03-25 MED ORDER — OXYCODONE HCL 5 MG/5ML PO SOLN: 5.0000 mg | Freq: Once | ORAL | Status: DC | PRN

## 2015-03-25 MED ORDER — OXYCODONE HCL 5 MG PO TABS: 5.0000 mg | ORAL_TABLET | Freq: Once | ORAL | Status: DC | PRN

## 2015-03-25 MED ORDER — PROPOFOL 10 MG/ML IV BOLUS
INTRAVENOUS | Status: AC
  Filled 2015-03-25: qty 20

## 2015-03-25 MED ORDER — LIDOCAINE HCL (CARDIAC) 20 MG/ML IV SOLN
INTRAVENOUS | Status: AC
  Filled 2015-03-25: qty 5

## 2015-03-25 MED ORDER — VANCOMYCIN HCL IN DEXTROSE 1-5 GM/200ML-% IV SOLN
INTRAVENOUS | Status: AC
  Filled 2015-03-25: qty 200

## 2015-03-25 MED ORDER — BUPIVACAINE-EPINEPHRINE (PF) 0.25% -1:200000 IJ SOLN
INTRAMUSCULAR | Status: DC | PRN
  Administered 2015-03-25: 17 mL

## 2015-03-25 MED ORDER — MIDAZOLAM HCL 5 MG/5ML IJ SOLN
INTRAMUSCULAR | Status: DC | PRN
  Administered 2015-03-25: 2 mg via INTRAVENOUS

## 2015-03-25 MED ORDER — FENTANYL CITRATE (PF) 100 MCG/2ML IJ SOLN: 50.0000 ug | INTRAMUSCULAR | Status: DC | PRN

## 2015-03-25 MED ORDER — HYDROMORPHONE HCL 1 MG/ML IJ SOLN: 0.2500 mg | INTRAMUSCULAR | Status: DC | PRN

## 2015-03-25 MED ORDER — GLYCOPYRROLATE 0.2 MG/ML IJ SOLN: 0.2000 mg | Freq: Once | INTRAMUSCULAR | Status: DC | PRN

## 2015-03-25 MED ORDER — CHLORHEXIDINE GLUCONATE 4 % EX LIQD: 1.0000 "application " | Freq: Once | CUTANEOUS | Status: DC

## 2015-03-25 MED ORDER — MEPERIDINE HCL 25 MG/ML IJ SOLN: 6.2500 mg | INTRAMUSCULAR | Status: DC | PRN

## 2015-03-25 MED ORDER — MIDAZOLAM HCL 2 MG/2ML IJ SOLN: 1.0000 mg | INTRAMUSCULAR | Status: DC | PRN

## 2015-03-25 MED ORDER — VANCOMYCIN HCL IN DEXTROSE 1-5 GM/200ML-% IV SOLN
1000.0000 mg | INTRAVENOUS | Status: AC
  Administered 2015-03-25: 1000 mg via INTRAVENOUS

## 2015-03-25 MED ORDER — PROPOFOL 10 MG/ML IV BOLUS
INTRAVENOUS | Status: DC | PRN
  Administered 2015-03-25: 150 mg via INTRAVENOUS

## 2015-03-25 MED ORDER — OXYCODONE-ACETAMINOPHEN 5-325 MG PO TABS: 1.0000 | ORAL_TABLET | ORAL | Status: DC | PRN

## 2015-03-25 MED ORDER — DEXAMETHASONE SODIUM PHOSPHATE 4 MG/ML IJ SOLN
INTRAMUSCULAR | Status: DC | PRN
  Administered 2015-03-25: 10 mg via INTRAVENOUS

## 2015-03-25 MED ORDER — LIDOCAINE HCL (CARDIAC) 20 MG/ML IV SOLN
INTRAVENOUS | Status: DC | PRN
  Administered 2015-03-25: 50 mg via INTRAVENOUS

## 2015-03-25 MED ORDER — SCOPOLAMINE 1 MG/3DAYS TD PT72
1.0000 | MEDICATED_PATCH | Freq: Once | TRANSDERMAL | Status: DC | PRN
  Administered 2015-03-25: 1.5 mg via TRANSDERMAL

## 2015-03-25 MED ORDER — DEXAMETHASONE SODIUM PHOSPHATE 10 MG/ML IJ SOLN
INTRAMUSCULAR | Status: AC
  Filled 2015-03-25: qty 1

## 2015-03-25 MED ORDER — ONDANSETRON HCL 4 MG/2ML IJ SOLN
INTRAMUSCULAR | Status: AC
  Filled 2015-03-25: qty 2

## 2015-03-25 MED ORDER — FENTANYL CITRATE (PF) 100 MCG/2ML IJ SOLN
INTRAMUSCULAR | Status: AC
  Filled 2015-03-25: qty 4

## 2015-03-25 MED ORDER — FENTANYL CITRATE (PF) 100 MCG/2ML IJ SOLN
INTRAMUSCULAR | Status: DC | PRN
  Administered 2015-03-25: 100 ug via INTRAVENOUS

## 2015-03-25 MED ORDER — SCOPOLAMINE 1 MG/3DAYS TD PT72
MEDICATED_PATCH | TRANSDERMAL | Status: AC
  Filled 2015-03-25: qty 1

## 2015-03-25 MED ORDER — ONDANSETRON HCL 4 MG/2ML IJ SOLN
INTRAMUSCULAR | Status: DC | PRN
  Administered 2015-03-25: 4 mg via INTRAVENOUS

## 2015-03-25 MED ORDER — MIDAZOLAM HCL 2 MG/2ML IJ SOLN
INTRAMUSCULAR | Status: AC
  Filled 2015-03-25: qty 4

## 2015-03-25 SURGICAL SUPPLY — 33 items
APPLIER CLIP 9.375 MED OPEN (MISCELLANEOUS) ×3
BINDER BREAST MEDIUM (GAUZE/BANDAGES/DRESSINGS) ×3 IMPLANT
BLADE SURG 15 STRL LF DISP TIS (BLADE) ×1 IMPLANT
BLADE SURG 15 STRL SS (BLADE) ×2
CHLORAPREP W/TINT 26ML (MISCELLANEOUS) ×3 IMPLANT
CLIP APPLIE 9.375 MED OPEN (MISCELLANEOUS) ×1 IMPLANT
COVER BACK TABLE 60X90IN (DRAPES) ×3 IMPLANT
COVER MAYO STAND STRL (DRAPES) ×3 IMPLANT
COVER PROBE W GEL 5X96 (DRAPES) ×3 IMPLANT
DEVICE DUBIN W/COMP PLATE 8390 (MISCELLANEOUS) ×3 IMPLANT
DRAPE LAPAROSCOPIC ABDOMINAL (DRAPES) ×3 IMPLANT
DRAPE UTILITY XL STRL (DRAPES) ×3 IMPLANT
ELECT COATED BLADE 2.86 ST (ELECTRODE) ×3 IMPLANT
ELECT REM PT RETURN 9FT ADLT (ELECTROSURGICAL) ×3
ELECTRODE REM PT RTRN 9FT ADLT (ELECTROSURGICAL) ×1 IMPLANT
GAUZE SPONGE 4X4 12PLY STRL (GAUZE/BANDAGES/DRESSINGS) ×3 IMPLANT
GLOVE BIO SURGEON STRL SZ7.5 (GLOVE) ×6 IMPLANT
GLOVE EXAM NITRILE MD LF STRL (GLOVE) ×3 IMPLANT
GLOVE SURG SS PI 7.0 STRL IVOR (GLOVE) ×3 IMPLANT
GOWN STRL REUS W/ TWL LRG LVL3 (GOWN DISPOSABLE) ×2 IMPLANT
GOWN STRL REUS W/TWL LRG LVL3 (GOWN DISPOSABLE) ×4
KIT MARKER MARGIN INK (KITS) ×3 IMPLANT
LIQUID BAND (GAUZE/BANDAGES/DRESSINGS) ×3 IMPLANT
NEEDLE HYPO 25X1 1.5 SAFETY (NEEDLE) ×3 IMPLANT
PACK BASIN DAY SURGERY FS (CUSTOM PROCEDURE TRAY) ×3 IMPLANT
PENCIL BUTTON HOLSTER BLD 10FT (ELECTRODE) ×3 IMPLANT
SLEEVE SCD COMPRESS KNEE MED (MISCELLANEOUS) ×3 IMPLANT
SPONGE LAP 18X18 X RAY DECT (DISPOSABLE) ×3 IMPLANT
SUT MON AB 4-0 PC3 18 (SUTURE) ×3 IMPLANT
SUT VICRYL 3-0 CR8 SH (SUTURE) ×3 IMPLANT
SYR CONTROL 10ML LL (SYRINGE) ×3 IMPLANT
TOWEL OR 17X24 6PK STRL BLUE (TOWEL DISPOSABLE) ×3 IMPLANT
TOWEL OR NON WOVEN STRL DISP B (DISPOSABLE) IMPLANT

## 2015-03-25 NOTE — H&P (Signed)
Amanda Li 02/18/2015 3:51 PM Location: [facility] Patient #: [id number] DOB: Jul 03, 1955 Married / Language: English / Race: White Female   History of Present Illness Sammuel Li. [name] MD; 02/18/2015 4:31 PM) Patient words: breast ca.  The patient is a 59 year old female who presents with breast cancer. We are asked to see the patient in consultation by Dr. Luberta Robertson to evaluate her for right breast DCIS. The patient is a 59 year old white female who recently went for a routine screening mammogram. At that time she was found to have a 3 mm area of microcalcification in the lower inner quadrant of the right breast. This was biopsied and came back as DCIS. She denies any breast pain. She has been having bloody right nipple discharge but is not sure how long this has been occurring. She denies any personal or family history of breast cancer. She does take hormone replacement therapy. Her tumor markers have not been reported yet. She also has a history of Multiple Sclerosis   Other Problems Amanda Li, CMA; 02/18/2015 3:52 PM) Bladder Problems Other disease, cancer, significant illness Thyroid Disease  Past Surgical History Amanda Li, CMA; 02/18/2015 3:52 PM) Breast Biopsy Right. Cesarean Section - Multiple Oral Surgery  Diagnostic Studies History Amanda Li, CMA; 02/18/2015 3:52 PM) Colonoscopy within last year Mammogram within last year Pap Smear 1-5 years ago  Allergies Amanda Li, [place]; 02/18/2015 3:53 PM) No Known Drug Allergies09/30/2016  Medication History Amanda Li, CMA; 02/18/2015 3:56 PM) Levothyroxine Sodium (100MCG Tablet, Oral) Active. Estradiol (0.1MG /24HR Patch Weekly, Transdermal) Active. Baclofen (20MG  Tablet, Oral) Active. Myrbetriq (25MG  Tablet ER 24HR, Oral) Active. DULoxetine HCl (60MG  Capsule DR Part, Oral) Active. Aspirin (81MG  Tablet Chewable, Oral) Active. Vitamin D (25000IU Capsule, Oral) Active. Calcium  Acetate (1000MG  Tablet, Oral) Active. Valium (5MG  Tablet, Oral) Active. Medications Reconciled  Social History Amanda Li, CMA; 02/18/2015 3:52 PM) Alcohol use Moderate alcohol use. Caffeine use Coffee. No drug use Tobacco use Never smoker.  Family History Amanda Li, [name]; 02/18/2015 3:52 PM) Arthritis Mother. Cancer Father. Heart Disease Mother. Hypertension Mother. Respiratory Condition Father.  Pregnancy / Birth History Amanda Li, [name]; 02/18/2015 3:52 PM) Age at menarche 37 years. Age of menopause <45 Gravida 4 Maternal age 45-35 Para 2    Review of Systems ([place]; 02/18/2015 3:52 PM) General Present- Fatigue. Not Present- Appetite Loss, Chills, Fever, Night Sweats, Weight Gain and Weight Loss. Skin Not Present- Change in Wart/Mole, Dryness, Hives, Jaundice, New Lesions, Non-Healing Wounds, Rash and Ulcer. HEENT Present- Seasonal Allergies and Wears glasses/contact lenses. Not Present- Earache, Hearing Loss, Hoarseness, Nose Bleed, Oral Ulcers, Ringing in the Ears, Sinus Pain, Sore Throat, Visual Disturbances and Yellow Eyes. Respiratory Not Present- Bloody sputum, Chronic Cough, Difficulty Breathing, Snoring and Wheezing. Breast Present- Nipple Discharge. Not Present- Breast Mass, Breast Pain and Skin Changes. Cardiovascular Present- Palpitations. Not Present- Chest Pain, Difficulty Breathing Lying Down, Leg Cramps, Rapid Heart Rate, Shortness of Breath and Swelling of Extremities. Gastrointestinal Present- Difficulty Swallowing. Not Present- Abdominal Pain, Bloating, Bloody Stool, Change in Bowel Habits, Chronic diarrhea, Constipation, Excessive gas, Gets full quickly at meals, Hemorrhoids, Indigestion, Nausea, Rectal Pain and Vomiting. Female Genitourinary Present- Frequency and Urgency. Not Present- Nocturia, Painful Urination and Pelvic Pain. Musculoskeletal Present- Back Pain and Muscle Weakness. Not Present- Joint Pain, Joint Stiffness,  Muscle Pain and Swelling of Extremities. Neurological Present- Numbness, Tingling, Trouble walking and Weakness. Not Present- Decreased Memory, Fainting, Headaches, Seizures and Tremor. Endocrine Present- Heat Intolerance. Not Present- Cold Intolerance,  Excessive Hunger, Hair Changes, Hot flashes and New Diabetes.  Vitals ([name] CMA; 02/18/2015 3:52 PM) 02/18/2015 3:52 PM Weight: 122 lb Height: 62in Body Surface Area: 1.56 m Body Mass Index: 22.31 kg/m  Temp.: 2F(Temporal)  Pulse: 64 (Regular)  BP: 118/68 (Sitting, Left Arm, Standard)     Physical Exam [name] S. [name] MD; 02/18/2015 4:30 PM) General Mental Status-Alert. General Appearance-Consistent with stated age. Hydration-Well hydrated. Voice-Normal.  Head and Neck Head-normocephalic, atraumatic with no lesions or palpable masses. Trachea-midline. Thyroid Gland Characteristics - normal size and consistency.  Eye Eyeball - Bilateral-Extraocular movements intact. Sclera/Conjunctiva - Bilateral-No scleral icterus.  Chest and Lung Exam Chest and lung exam reveals -quiet, even and easy respiratory effort with no use of accessory muscles and on auscultation, normal breath sounds, no adventitious sounds and normal vocal resonance. Inspection Chest Wall - Normal. Back - normal.  Breast Note: There is a palpable bruising the lower inner quadrant of the right breast. Other than this there is no palpable mass in either breast. There is no palpable axillary, supraclavicular, or cervical lymphadenopathy.   Cardiovascular Cardiovascular examination reveals -normal heart sounds, regular rate and rhythm with no murmurs and normal pedal pulses bilaterally.  Abdomen Inspection Inspection of the abdomen reveals - No Hernias. Skin - Scar - no surgical scars. Palpation/Percussion Palpation and Percussion of the abdomen reveal - Soft, Non Tender, No Rebound tenderness, No Rigidity (guarding) and No  hepatosplenomegaly. Auscultation Auscultation of the abdomen reveals - Bowel sounds normal.  Neurologic Neurologic evaluation reveals -alert and oriented x 3 with no impairment of recent or remote memory. Mental Status-Normal.  Musculoskeletal Normal Exam - Left-Upper Extremity Strength Normal and Lower Extremity Strength Normal. Normal Exam - Right-Upper Extremity Strength Normal and Lower Extremity Strength Normal.  Lymphatic Head & Neck  General Head & Neck Lymphatics: Bilateral - Description - Normal. Axillary  General Axillary Region: Bilateral - Description - Normal. Tenderness - Non Tender. Femoral & Inguinal  Generalized Femoral & Inguinal Lymphatics: Bilateral - Description - Normal. Tenderness - Non Tender.    Assessment & Plan [name] S. [name] MD; 02/18/2015 4:28 PM) DCIS (DUCTAL CARCINOMA IN SITU), RIGHT (D05.11) Impression: The patient appears to have a small area of DCIS in the lower inner quadrant of the right breast. Because this has been accompanied by bloody nipple discharge I would recommend getting an MRI as a preoperative evaluation. If the MRI does not show any additional disease then she would be a candidate for breast conservation. If the MRI shows more enhancement then she may need a second biopsy to define the extent of disease. I have talked to her in detail about the different options for treatment and at this point she favors breast conservation as long as the MRI is consistent with her current imaging. I will call her with the results of the study. Current Plans MRI, BOTH BREASTS ([id number]) Instructed to make follow-up appointment for office visit following completion of diagnostic tests  Pt Education - Breast Cancer: discussed with patient and provided information.   Signed by Luella Cook, MD (02/18/2015 4:32 PM)

## 2015-03-25 NOTE — Anesthesia Procedure Notes (Signed)
Procedure Name: LMA Insertion Date/Time: 03/25/2015 11:06 AM Performed by: Toula Moos L Pre-anesthesia Checklist: Patient identified, Emergency Drugs available, Suction available, Patient being monitored and Timeout performed Patient Re-evaluated:Patient Re-evaluated prior to inductionOxygen Delivery Method: Circle System Utilized Preoxygenation: Pre-oxygenation with 100% oxygen Intubation Type: IV induction Ventilation: Mask ventilation without difficulty LMA: LMA inserted LMA Size: 4.0 Number of attempts: 1 Airway Equipment and Method: Bite block Placement Confirmation: positive ETCO2 Tube secured with: Tape Dental Injury: Teeth and Oropharynx as per pre-operative assessment

## 2015-03-25 NOTE — Op Note (Signed)
03/25/2015  11:52 AM  PATIENT:  Amanda Li  59 y.o. female  PRE-OPERATIVE DIAGNOSIS:  RIGHT BREAST DCIS  POST-OPERATIVE DIAGNOSIS:  RIGHT BREAST DCIS  PROCEDURE:  Procedure(s): BREAST LUMPECTOMY WITH RADIOACTIVE SEED LOCALIZATION (Right)  SURGEON:  Surgeon(s) and Role:    * Jovita Kussmaul, MD - Primary  PHYSICIAN ASSISTANT:   ASSISTANTS: none   ANESTHESIA:   general  EBL:     BLOOD ADMINISTERED:none  DRAINS: none   LOCAL MEDICATIONS USED:  MARCAINE     SPECIMEN:  Source of Specimen:  right breast tissue  DISPOSITION OF SPECIMEN:  PATHOLOGY  COUNTS:  YES  TOURNIQUET:  * No tourniquets in log *  DICTATION: .Dragon Dictation   After informed consent was obtained the patient was brought to the operating room and placed in the supine position on the operating room table. After adequate induction of general anesthesia the patient's right breast was prepped with ChloraPrep, allowed to dry, and draped in usual sterile manner. Previously an I-125 seed was placed in the medial aspect of the right breast marking area of DCIS. The neoprobe was set to I-125 in the area of radioactivity was identified readily in the medial right breast at the edge of the areola. A curvilinear incision was made along the edge of the areola on the medial right breast with a 15 blade knife. The incision was carried through the skin and subcutaneous tissue sharply with electrocautery. The skin was retracted with skin hooks. While checking the area of radioactivity frequently with the neoprobe a circular portion of breast tissue was excised sharply around the radioactive seed. Once the specimen was removed it was oriented with the appropriate paint colors. A specimen radiograph showed the clip in seed to be in the specimen. The specimen was then sent to pathology for further evaluation. I then shaved the medial and inferior edges of the cavity with Metzenbaum scissors and these margins were marked with ink  and also sent to pathology for further evaluation. Hemostasis was achieved using the Bovie electrocautery. The wound was irrigated with saline and infiltrated with quarter percent Marcaine. The deep layer of the wound was closed with layers of interrupted 3-0 Vicryl stitches. The skin was then closed with interrupted 4-0 Monocryl subcuticular stitches. Dermabond dressings were applied. The patient tolerated the procedure well. At the end of the case all needle sponge and isthmic counts were correct. The patient was then awakened and taken to recovery in stable condition. A breast binder was applied.  PLAN OF CARE: Discharge to home after PACU  PATIENT DISPOSITION:  PACU - hemodynamically stable.   Delay start of Pharmacological VTE agent (>24hrs) due to surgical blood loss or risk of bleeding: not applicable

## 2015-03-25 NOTE — Anesthesia Preprocedure Evaluation (Addendum)
Anesthesia Evaluation  Patient identified by MRN, date of birth, ID band Patient awake    Reviewed: Allergy & Precautions, NPO status , Patient's Chart, lab work & pertinent test results  Airway Mallampati: I  TM Distance: >3 FB Neck ROM: Full    Dental  (+) Teeth Intact, Dental Advisory Given   Pulmonary    breath sounds clear to auscultation       Cardiovascular  Rhythm:Regular Rate:Normal     Neuro/Psych Hx of MS since 1988; progressive sx of Left sided weakness    GI/Hepatic   Endo/Other  Hypothyroidism   Renal/GU      Musculoskeletal   Abdominal   Peds  Hematology   Anesthesia Other Findings   Reproductive/Obstetrics                            Anesthesia Physical Anesthesia Plan  ASA: III  Anesthesia Plan: General   Post-op Pain Management:    Induction: Intravenous  Airway Management Planned: LMA  Additional Equipment:   Intra-op Plan:   Post-operative Plan: Extubation in OR  Informed Consent: I have reviewed the patients History and Physical, chart, labs and discussed the procedure including the risks, benefits and alternatives for the proposed anesthesia with the patient or authorized representative who has indicated his/her understanding and acceptance.   Dental advisory given  Plan Discussed with: CRNA, Anesthesiologist and Surgeon  Anesthesia Plan Comments:         Anesthesia Quick Evaluation

## 2015-03-25 NOTE — Transfer of Care (Signed)
Immediate Anesthesia Transfer of Care Note  Patient: Amanda Li  Procedure(s) Performed: Procedure(s): BREAST LUMPECTOMY WITH RADIOACTIVE SEED LOCALIZATION (Right)  Patient Location: PACU  Anesthesia Type:General  Level of Consciousness: awake and patient cooperative  Airway & Oxygen Therapy: Patient Spontanous Breathing and Patient connected to face mask oxygen  Post-op Assessment: Report given to RN and Post -op Vital signs reviewed and stable  Post vital signs: Reviewed and stable  Last Vitals:  Filed Vitals:   03/25/15 1016  BP: 124/78  Pulse: 54  Temp: 36.8 C  Resp: 18    Complications: No apparent anesthesia complications

## 2015-03-25 NOTE — Discharge Instructions (Signed)

## 2015-03-25 NOTE — Interval H&P Note (Signed)
History and Physical Interval Note:  03/25/2015 5:49 AM  Amanda Li  has presented today for surgery, with the diagnosis of RIGHT BREAST DCIS  The various methods of treatment have been discussed with the patient and family. After consideration of risks, benefits and other options for treatment, the patient has consented to  Procedure(s): BREAST LUMPECTOMY WITH RADIOACTIVE SEED LOCALIZATION (Right) as a surgical intervention .  The patient's history has been reviewed, patient examined, no change in status, stable for surgery.  I have reviewed the patient's chart and labs.  Questions were answered to the patient's satisfaction.     TOTH III,PAUL S

## 2015-03-25 NOTE — Anesthesia Postprocedure Evaluation (Signed)
  Anesthesia Post-op Note  Patient: Amanda Li  Procedure(s) Performed: Procedure(s): BREAST LUMPECTOMY WITH RADIOACTIVE SEED LOCALIZATION (Right)  Patient Location: PACU  Anesthesia Type: General   Level of Consciousness: awake, alert  and oriented  Airway and Oxygen Therapy: Patient Spontanous Breathing  Post-op Pain: none  Post-op Assessment: Post-op Vital signs reviewed  Post-op Vital Signs: Reviewed  Last Vitals:  Filed Vitals:   03/25/15 1315  BP: 130/72  Pulse:   Temp: 37.1 C  Resp: 16    Complications: No apparent anesthesia complications

## 2015-03-28 ENCOUNTER — Encounter (HOSPITAL_BASED_OUTPATIENT_CLINIC_OR_DEPARTMENT_OTHER): Payer: Self-pay | Admitting: General Surgery

## 2015-03-28 NOTE — Addendum Note (Signed)
Addendum  created 03/28/15 1005 by Tawni Millers, CRNA   Modules edited: Charges VN

## 2015-04-06 ENCOUNTER — Ambulatory Visit: Payer: 59

## 2015-04-06 ENCOUNTER — Ambulatory Visit: Payer: 59 | Admitting: Radiation Oncology

## 2015-04-11 ENCOUNTER — Encounter: Payer: Self-pay | Admitting: Hematology

## 2015-04-11 ENCOUNTER — Telehealth: Payer: Self-pay | Admitting: Hematology

## 2015-04-11 ENCOUNTER — Other Ambulatory Visit: Payer: 59

## 2015-04-11 ENCOUNTER — Ambulatory Visit (HOSPITAL_BASED_OUTPATIENT_CLINIC_OR_DEPARTMENT_OTHER): Payer: 59 | Admitting: Hematology

## 2015-04-11 VITALS — BP 141/78 | HR 114 | Temp 98.1°F | Resp 18 | Ht 62.0 in | Wt 120.7 lb

## 2015-04-11 DIAGNOSIS — G35 Multiple sclerosis: Secondary | ICD-10-CM

## 2015-04-11 DIAGNOSIS — C50311 Malignant neoplasm of lower-inner quadrant of right female breast: Secondary | ICD-10-CM | POA: Diagnosis not present

## 2015-04-11 NOTE — Telephone Encounter (Signed)
per pof to f/u as needed in future

## 2015-04-11 NOTE — Progress Notes (Signed)
[place]  [phone] [phone]  Clinic follow up Note   Patient Care Team: Marton Redwood, MD as PCP - General (Internal Medicine) Truitt Merle, MD as Consulting Physician (Hematology) Autumn Messing III, MD as Consulting Physician (General Surgery) Arloa Koh, MD as Consulting Physician (Radiation Oncology) 04/11/2015  Referring physician: Dr. [name]  CHIEF COMPLAINTS:  Right breast DCIS  Oncology History   Breast cancer of lower-inner quadrant of right female breast [facility])   Staging form: Breast, AJCC 7th Edition     Clinical stage from 02/28/2015: Stage 0 (Tis (DCIS), N0, M0) - Signed by Truitt Merle, MD on 02/28/2015       Breast cancer of lower-inner quadrant of right female breast ([place])   02/15/2015 Receptors her2 Your 100% positive, PR 100% positive   02/15/2015 Initial Biopsy Right breast core needle biopsy at the 3:30 o'clock showed DCIS with calcifications, intermediate grade, intraductal papilloma.   02/15/2015 Mammogram Diagnostic breast mammogram and ultrasound showed a 3 mm group of slightly suspicious calcification located within the lower inner quadrant of the right breast.   02/23/2015 Imaging Breast MRI showed no abnormal enhancement in either breast.   02/28/2015 Initial Diagnosis Breast cancer of lower-inner quadrant of right female breast ([place])   03/25/2015 Surgery right breast lumpectomy, no residual cancer. No hyperplasia or atypia      HISTORY OF PRESENTING ILLNESS:  Amanda Li 59 y.o. female is here because of recently diagnosed right breast DCIS.  This was discovered by screening mammogram. She had had nipple discharge for 6 months, very small amount, initially clear, occationally bloody, no palpable breast mass or lumps. She otherwise denies any new symptoms. Her mammogram showed a small area of calcification, she underwent diagnostic mammogram and ultrasound and core needle biopsy of the right breast lesion, which showed  intermediate grade DCIS, and intraductal papilloma.     She was diagnosed with MS in 1988, she follows up with her neurologist Dr. Caffie Damme at [place]. she was initially on interferon, copaxane, and late on switched to [place], which was stopped 5 years ago due to the ineffectiveness. She has had some disease progression for the past several month, especially in the past month. She has foot drop, not able to stand up and walk well, she walks with a crane, and she is very fatigue, she has mild tremor in left hand, and some urinary incontinyue, difficult to write, she is able to take care of her self, but takes time, she does not do much else at home. She denies any significant pain, appetite and by mouth intake are normal. No weight loss   INTERIM HISTORY: Valera returns for follow-up. She underwent right breast lumpectomy on 04/04/2015. She tolerated the surgery very well, and has recovered well. She has minimal pain at his incision site, and other new complaints. She noticed her MS symptoms got slightly worse after surgery, which she contributes to general anesthesia.   MEDICAL HISTORY:  Past Medical History  Diagnosis Date  . Allergy     seasonal  . Depression   . Thyroid disease     hypothyroid  . Multiple sclerosis ([place])   . Joint pain 02/2015    legs  . Hypothyroidism   . Urinary frequency   . Dental crowns present     SURGICAL HISTORY: Past Surgical History  Procedure Laterality Date  . Cesarean section      x 2  . Laparoscopic abdominal exploration    .  Colonoscopy with propofol  03/25/2014  . Breast lumpectomy with radioactive seed localization Right 03/25/2015    Procedure: BREAST LUMPECTOMY WITH RADIOACTIVE SEED LOCALIZATION;  Surgeon: Autumn Messing III, MD;  Location: [place];  Service: General;  Laterality: Right;   GYN HISTORY  Menarchal: 12 LMP: 10 Contraceptive: none  HRT: 10 years, stopped on 02/15/2015  G2P2: breast  feeding (+)    SOCIAL HISTORY: Social History   Social History  . Marital Status: Married    Spouse Name: N/A  . Number of Children: 53 son, 87 daughter   . Years of Education: N/A   Occupational History  . She was a Software engineer    Social History Main Topics  . Smoking status: Never Smoker   . Smokeless tobacco: Never Used  . Alcohol Use: 4.2 oz/week    7 Standard drinks or equivalent per week     Comment: 1 glass of wine a day  . Drug Use: No  . Sexual Activity: Not on file   Other Topics Concern  . Not on file   Social History Narrative    FAMILY HISTORY: Family History  Problem Relation Age of Onset  . Heart disease Mother   . Lung cancer Father     ALLERGIES:  is allergic to fentanyl and penicillins.  MEDICATIONS:  Current Outpatient Prescriptions  Medication Sig Dispense Refill  . baclofen (LIORESAL) 20 MG tablet Take 20 mg by mouth 3 (three) times daily.     Marland Kitchen buPROPion (WELLBUTRIN XL) 150 MG 24 hr tablet Take 1 tablet by mouth daily.  6  . Cholecalciferol (PA VITAMIN D-3) 2000 UNITS CAPS Take 4,000 Units by mouth daily.     . DULoxetine (CYMBALTA) 60 MG capsule Take 60 mg by mouth daily.     Marland Kitchen levothyroxine (SYNTHROID, LEVOTHROID) 100 MCG tablet Take 100 mcg by mouth daily.     . mirabegron ER (MYRBETRIQ) 25 MG TB24 tablet Take 25 mg by mouth daily.     Marland Kitchen oxyCODONE-acetaminophen (ROXICET) 5-325 MG tablet Take 1-2 tablets by mouth every 4 (four) hours as needed. 50 tablet 0   No current facility-administered medications for this visit.    REVIEW OF SYSTEMS:   Constitutional: Denies fevers, chills or abnormal night sweats Eyes: Denies blurriness of vision, double vision or watery eyes Ears, nose, mouth, throat, and face: Denies mucositis or sore throat Respiratory: Denies cough, dyspnea or wheezes Cardiovascular: Denies palpitation, chest discomfort or lower extremity swelling Gastrointestinal:  Denies nausea, heartburn or change in bowel habits Skin:  Denies abnormal skin rashes Lymphatics: Denies new lymphadenopathy or easy bruising Neurological:Denies numbness, tingling or new weaknesses Behavioral/Psych: Mood is stable, no new changes  All other systems were reviewed with the patient and are negative.  PHYSICAL EXAMINATION: ECOG PERFORMANCE STATUS: 3 - Symptomatic, >50% confined to bed  Filed Vitals:   04/11/15 1308  BP: 141/78  Pulse: 114  Temp: 98.1 F (36.7 C)  Resp: 18   Filed Weights   04/11/15 1308  Weight: 120 lb 11.2 oz (54.749 kg)    GENERAL:alert, no distress and comfortable SKIN: skin color, texture, turgor are normal, no rashes or significant lesions EYES: normal, conjunctiva are pink and non-injected, sclera clear OROPHARYNX:no exudate, no erythema and lips, buccal mucosa, and tongue normal  NECK: supple, thyroid normal size, non-tender, without nodularity LYMPH:  no palpable lymphadenopathy in the cervical, axillary or inguinal LUNGS: clear to auscultation and percussion with normal breathing effort HEART: regular rate & rhythm and no  murmurs and no lower extremity edema ABDOMEN:abdomen soft, non-tender and normal bowel sounds Musculoskeletal:no cyanosis of digits and no clubbing  PSYCH: alert & oriented x 3 with fluent speech NEURO: no focal motor/sensory deficits Breasts: Breast inspection showed them to be symmetrical with no nipple discharge. (+) Right breast incision site is healing well, drinking and no discharge. Palpation of the left breasts and axilla revealed no obvious mass that I could appreciate.   LABORATORY DATA:  I have reviewed the data as listed No results found for: WBC, HGB, HCT, MCV, PLT No results for input(s): NA, K, CL, CO2, GLUCOSE, BUN, CREATININE, CALCIUM, GFRNONAA, GFRAA, PROT, ALBUMIN, AST, ALT, ALKPHOS, BILITOT, BILIDIR, IBILI in the last 8760 hours.  PATHOLOGY REPORT Diagnosis 02/15/2015 Breast, right, needle core biopsy, inner quadrant 3:30 o'clock - DUCTAL CARCINOMA IN  SITU WITH CALCIFICATIONS. - INTRADUCTAL PAPILLOMA.D - SEE COMMENT. Microscopic Comment The carcinoma appears intermediate grade. Immunohistochemical stains for p63, smooth muscle myosin, and calponin highlight the presence of myoepithelial cells around the in situ carcinoma. Estrogen receptor and progesterone receptor studies will be performed and the results reported separately. The results were called to [facility] on 02/17/15. (JBK:ds:ecj 02/17/15)  Results: IMMUNOHISTOCHEMICAL AND MORPHOMETRIC ANALYSIS PERFORMED MANUALLY Estrogen Receptor: 100%, POSITIVE, STRONG STAINING INTENSITY Progesterone Receptor: 100%, POSITIVE, STRONG STAINING INTENSITY   Diagnosis 03/25/2015  1. Breast, lumpectomy, right - BREAST PARENCHYMA DEMONSTRATING INTRADUCTAL PAPILLOMA FORMATION, BIOPSY SITE CHANGES, AND FAT NECROSIS. - NO HYPERPLASIA, ATYPIA, OR MALIGNANCY IDENTIFIED. - SEE COMMENT. 2. Breast, excision, right new medial margin - BENIGN BREAST TISSUE. - SCATTERED CHRONIC INFLAMMATION. - NO ATYPIA, HYPERPLASIA, OR MALIGNANCY IDENTIFIED. 3. Breast, excision, right new inferior margin - BENIGN BREAST TISSUE. - NO ATYPIA, HYPERPLASIA, OR MALIGNANCY IDENTIFIED. RADIOGRAPHIC STUDIES: I have personally reviewed the radiological images as listed and agreed with the findings in the report.  Mm Breast Surgical Specimen  03/25/2015  CLINICAL DATA:  Status post right breast lumpectomy for DCIS. EXAM: SPECIMEN RADIOGRAPH OF THE RIGHT BREAST COMPARISON:  Previous exam(s). FINDINGS: Status post excision of the right breast. The radioactive seed and biopsy marker clip are present, completely intact, and were marked for pathology. Calcifications are seen within the specimen. IMPRESSION: Specimen radiograph of the right breast. Electronically Signed   By: Fidela Salisbury M.D.   On: 03/25/2015 11:58   Mm Rt Radioactive Seed Loc Mammo Guide  03/21/2015  CLINICAL DATA:  Ductal carcinoma in  situ of the right breast. Radioactive seed localization was requested prior to lumpectomy. EXAM: MAMMOGRAPHIC GUIDED RADIOACTIVE SEED LOCALIZATION OF THE RIGHT BREAST COMPARISON:  Previous exam(s). FINDINGS: Patient presents for radioactive seed localization prior to lumpectomy. Prior to beginning the procedure, CC and MLO views of the right breast were performed to confirm interval decrease in size of the post biopsy hematoma in the right breast. These demonstrate that the hematoma has nearly completely resolved. I met with the patient and we discussed the procedure of seed localization including benefits and alternatives. We discussed the high likelihood of a successful procedure. We discussed the risks of the procedure including infection, bleeding, tissue injury and further surgery. We discussed the low dose of radioactivity involved in the procedure. Informed, written consent was given. The usual time-out protocol was performed immediately prior to the procedure. Using mammographic guidance, sterile technique, 2% lidocaine and an I-125 radioactive seed, and X shaped biopsy clip in the medial right breast was localized using a medial to lateral approach. The follow-up mammogram images confirm the seed in the  expected location and were marked for Dr. [name]. Follow-up survey of the patient confirms presence of the radioactive seed. Order number of I-125 seed:  [id number]. Total activity:  9.276 millicuries  Reference Date: 23 February 2015 The patient tolerated the procedure well and was released from the [facility]. She was given instructions regarding seed removal. IMPRESSION: Radioactive seed localization right breast. No apparent complications. Electronically Signed   By: Curlene Dolphin M.D.   On: 03/21/2015 16:23    ASSESSMENT & PLAN: 59 year old Caucasian female, with past medical history of multiple sclerosis,   1. Right breast DCIS, ER+/PR+ -I discussed her surgical pathology results, which showed no  residual tumor cells.  -We discussed that the tumor is noninvasive, and she is cured by complete surgical resection -Any form of adjuvant therapy is to prevent future rest cancer. She is at high risk of developing second breast cancer in the future. -We discussed chemoprevention with tamoxifen or anastrozole. Given her significant and worsening MS, I do not think she can tolerate anastrozole due to side effects, so saint joints. We reviewed the benefit and risks of tamoxifen, which includes but not limited to, hot flash, skin or vaginal dryness, thrombosis, endometrial cancer, slightly increased risk of cardiovascularl disease, etc. Tamoxifen probably will decrease her risk of breast cancer by 40%, however there is no data to support survival benefit.  -Again, giving her severe MS symptoms, I do not recommend chemoprevention with tamoxifen, due to the potential side effects and lack of survival benefit. She agrees with the plan -We discussed breast cancer screening, which includes annual diagnostic mammogram, self breast exam, breast exam by a physician every 6 months. She will follow-up with her primary care physician for cancer screening in the future. -I encouraged her to have healthy diet, and try to be as physically active as she can. -I encouraged her to take calcium and vitamin D for bone health. -She is scheduled to see radiation oncologist Dr. Valere Dross to discuss breast irradiation.  2. MS -She will continue follow-up with her neurologist  Follow up: She will follow-up with her primary care physician for her breast cancer surveillance. I'll see her on as-needed basis in the future..   All questions were answered. The patient knows to call the clinic with any problems, questions or concerns.  I spent 20 minutes counseling the patient face to face. The total time spent in the appointment was 25  minutes and more than 50% was on counseling.     Truitt Merle, MD 04/11/2015 8:03 AM

## 2015-04-11 NOTE — Telephone Encounter (Signed)
cld pt to [facility] today's appt was for MD only-per Fr Burr Medico no labs

## 2015-04-12 ENCOUNTER — Ambulatory Visit
Admission: RE | Admit: 2015-04-12 | Discharge: 2015-04-12 | Disposition: A | Discharge status: Home / Self Care | Payer: 59 | Source: Ambulatory Visit | Attending: Radiation Oncology | Admitting: Radiation Oncology

## 2015-04-12 ENCOUNTER — Encounter: Payer: Self-pay | Admitting: Radiation Oncology

## 2015-04-12 VITALS — BP 123/80 | HR 53 | Temp 98.0°F | Ht 62.0 in | Wt 120.0 lb

## 2015-04-12 DIAGNOSIS — Z510 Encounter for antineoplastic radiation therapy: Secondary | ICD-10-CM | POA: Diagnosis not present

## 2015-04-12 DIAGNOSIS — C50311 Malignant neoplasm of lower-inner quadrant of right female breast: Secondary | ICD-10-CM

## 2015-04-12 NOTE — Progress Notes (Signed)
Location of Breast Cancer: Right Breast  Histology per Pathology Report:  03/25/15 Diagnosis 1. Breast, lumpectomy, right - BREAST PARENCHYMA DEMONSTRATING INTRADUCTAL PAPILLOMA FORMATION, BIOPSY SITE CHANGES, AND FAT NECROSIS. - NO HYPERPLASIA, ATYPIA, OR MALIGNANCY IDENTIFIED. - SEE COMMENT. 2. Breast, excision, right new medial margin - BENIGN BREAST TISSUE. - SCATTERED CHRONIC INFLAMMATION. - NO ATYPIA, HYPERPLASIA, OR MALIGNANCY IDENTIFIED. 3. Breast, excision, right new inferior margin - BENIGN BREAST TISSUE. - NO ATYPIA, HYPERPLASIA, OR MALIGNANCY IDENTIFIED  Receptor Status: ER(100%), PR (100%), Her2-neu ()   Did patient present with symptoms (if so, please note symptoms) or was this found on screening mammography?: This was discovered by screening mammogram. She had had nipple discharge for 6 months, very small amount, initially clear, occationally bloody, no palpable breast mass or lumps. She otherwise denies any new symptoms.  Past/Anticipated interventions by surgeon, if any: Right needle core biopsy, inner quadrant 3:30 o'clock.  Past/Anticipated interventions by medical oncology, if any: Per Dr. Burr Medico: I'll see her back in 2-3 weeks after her breast surgery, to review her surgical past and finalize her antiestrogen therapy.  Lymphedema issues, if any: NO  Pain issues, if any: Reports soreness of her breast  SAFETY ISSUES:  Prior radiation? NO  Pacemaker/ICD? No  Possible current pregnancy? No  Is the patient on methotrexate? No  Current Complaints / other details: GYN HISTORY  Menarchal: 12 LMP: 10 Contraceptive: none  HRT: 10 years, stopped on 02/15/2015  G2P2: breast feeding (+)

## 2015-04-12 NOTE — Progress Notes (Addendum)
CC: Dr. Marton Redwood, Dr. Autumn Messing III, Dr. Truitt Merle   Diagnosis: Stage 0 (Tis N0 M0) DCIS of the right breast.  History: [name] is a pleasant 59 year old female who is seen today for review of her biopsy proven DCIS of the right breast.  I first saw her in consultation on 03/02/2015. At the time of a screening mammogram on 02/15/2015 she was found to have a 3 mm area of calcifications at the 3:30 o'clock location. Because of a history of right nipple discharge a ductogram was attempted but this was unsuccessful on September 27. This is to be rescheduled. On 02/15/2015 she underwent a stereotactic biopsy of her right breast calcifications with the diagnosis of DCIS with calcifications along with intraductal papilloma. The DCIS was felt to be intermediate grade and was ER positive and PR positive, both at 100%. Breast MR on 02/23/2015 was without abnormal enhancement in either breast. A hematoma was seen within the right breast, medial aspect. On 03/25/2015 she underwent a right partial mastectomy.  She was found to have an intraductal papilloma with biopsy changes but no evidence for malignancy.  She is doing well postoperatively.  She was recently seen by Dr. Burr Medico.  Physical examination: Alert and oriented 60 year old female appearing her stated age. Filed Vitals:   04/12/15 0737  BP: 123/80  Pulse: 53  Temp: 98 F (36.7 C)   Nodes: There is no palpable cervical, supraclavicular, or axillary lymphadenopathy.  Breasts: There is a periareolar wound extending from approximately 2 to 5:00 along the right breast which is healing well.  No masses are appreciated.  Left breast without masses or lesions.  Extremities: Without edema.  Impression: Stage 0 (Tis N0 M0) DCIS of the right breast.  I explained to the patient that her prognosis is excellent.  Her risk for a local recurrence is low.  We reviewed the [facility] DCIS Nomogram which predicts for a local recurrence rate of  13% at 10 years with no adjuvant therapy, 6% with antiestrogen therapy alone, 5% with adjuvant radiation therapy alone, or 2% with both adjuvant antiestrogen, and radiation therapy.  Regardless of adjuvant therapy overall survival would still be in the vicinity of 99%.  Adjuvant therapy would only decrease her risk for a local recurrence which is almost always salvaged by additional conservative surgery or mastectomy.  We discussed the potential acute and late toxicities of radiation therapy.  She would be a candidate for hypofractionated treatment.  She will think things over and discuss with Dr. Burr Medico possible antiestrogen therapies and side effects considering her multiple sclerosis.  She'll contact me if she does not take antiestrogen therapy, and wants to reduce her risk for local recurrence.  I do recommend that she have a postoperative mammogram to confirm removal of all suspicious microcalcifications and this will serve as a baseline study.  [facility] early [facility] mid December.  Plan: As above.  30 minutes was spent face-to-face the patient, primarily counseling patient and coordinating her care.

## 2015-04-13 ENCOUNTER — Encounter: Payer: Self-pay | Admitting: Radiation Oncology

## 2015-04-13 NOTE — Progress Notes (Signed)
CC: Dr. Marton Redwood, Dr. Autumn Messing  III, Dr. Truitt Merle   Chart note: The patient called me this morning and she does not want to proceed with antiestrogen therapy because of possible side effects related to her underlying diagnosis of multiple sclerosis.  She would like to proceed with hypofractionated radiation therapy (16 fractions) to her right breast to reduce her risk for local recurrence.  We can get her started in early December and have her finished by the end of the year.  She is scheduled for a baseline mammogram on November 30.

## 2015-04-20 ENCOUNTER — Ambulatory Visit
Admission: RE | Admit: 2015-04-20 | Discharge: 2015-04-20 | Disposition: A | Discharge status: Home / Self Care | Payer: 59 | Source: Ambulatory Visit | Attending: Radiation Oncology | Admitting: Radiation Oncology

## 2015-04-20 DIAGNOSIS — C50311 Malignant neoplasm of lower-inner quadrant of right female breast: Secondary | ICD-10-CM

## 2015-04-21 ENCOUNTER — Ambulatory Visit
Admission: RE | Admit: 2015-04-21 | Discharge: 2015-04-21 | Disposition: A | Discharge status: Home / Self Care | Payer: 59 | Source: Ambulatory Visit | Attending: Radiation Oncology | Admitting: Radiation Oncology

## 2015-04-21 DIAGNOSIS — Z510 Encounter for antineoplastic radiation therapy: Secondary | ICD-10-CM | POA: Diagnosis not present

## 2015-04-21 DIAGNOSIS — C50311 Malignant neoplasm of lower-inner quadrant of right female breast: Secondary | ICD-10-CM

## 2015-04-21 NOTE — Progress Notes (Signed)
Complex simulation/treatment planning note: The patient was taken to the CT simulator.  A Vac lock immobilization device was constructed on a custom breast board.  I marked her right breast borders with radiopaque wires.  She was then scanned.  I chose an isocenter.  The CT data set was sent to the planning system where contoured her tumor bed.  She was setup to medial and lateral right breast tangents with 2 unique MLCs.  I am prescribing 4256 cGy 16 sessions utilizing 6 MV photons.  She is now ready for 3-D simulation.

## 2015-04-22 ENCOUNTER — Other Ambulatory Visit: Payer: Self-pay | Admitting: *Deleted

## 2015-04-22 ENCOUNTER — Telehealth: Payer: Self-pay | Admitting: Hematology

## 2015-04-22 NOTE — Telephone Encounter (Signed)
lvm for pt regarding to Jan appt....pt ok a;nd aware

## 2015-04-25 ENCOUNTER — Encounter: Payer: Self-pay | Admitting: Radiation Oncology

## 2015-04-25 DIAGNOSIS — Z510 Encounter for antineoplastic radiation therapy: Secondary | ICD-10-CM | POA: Diagnosis not present

## 2015-04-25 NOTE — Progress Notes (Signed)
3-D simulation note: The patient called me to 3-D simulation for treatment to her right breast.  She was set up to medial and lateral right breast tangents.  Each tangent had 1 unique MLC along with  unique electronic compensation fields for a total four complex treatment devices.  I am prescribing 4256 cGy 16 sessions utilizing 6 MV photons.  No boost.  Dose volume histograms were obtained for the right breast/tumor bed and also her lungs and heart.  We met our departmental guidelines.

## 2015-04-26 ENCOUNTER — Ambulatory Visit
Admission: RE | Admit: 2015-04-26 | Discharge: 2015-04-26 | Disposition: A | Discharge status: Home / Self Care | Payer: 59 | Source: Ambulatory Visit | Attending: Radiation Oncology | Admitting: Radiation Oncology

## 2015-04-26 DIAGNOSIS — Z510 Encounter for antineoplastic radiation therapy: Secondary | ICD-10-CM | POA: Diagnosis not present

## 2015-04-27 ENCOUNTER — Ambulatory Visit
Admission: RE | Admit: 2015-04-27 | Discharge: 2015-04-27 | Disposition: A | Discharge status: Home / Self Care | Payer: 59 | Source: Ambulatory Visit | Attending: Radiation Oncology | Admitting: Radiation Oncology

## 2015-04-27 DIAGNOSIS — Z510 Encounter for antineoplastic radiation therapy: Secondary | ICD-10-CM | POA: Diagnosis not present

## 2015-04-28 ENCOUNTER — Ambulatory Visit
Admission: RE | Admit: 2015-04-28 | Discharge: 2015-04-28 | Disposition: A | Discharge status: Home / Self Care | Payer: 59 | Source: Ambulatory Visit | Attending: Radiation Oncology | Admitting: Radiation Oncology

## 2015-04-28 ENCOUNTER — Ambulatory Visit: Payer: 59 | Admitting: Radiation Oncology

## 2015-04-28 DIAGNOSIS — Z510 Encounter for antineoplastic radiation therapy: Secondary | ICD-10-CM | POA: Diagnosis not present

## 2015-04-29 ENCOUNTER — Ambulatory Visit
Admission: RE | Admit: 2015-04-29 | Discharge: 2015-04-29 | Disposition: A | Discharge status: Home / Self Care | Payer: 59 | Source: Ambulatory Visit | Attending: Radiation Oncology | Admitting: Radiation Oncology

## 2015-04-29 DIAGNOSIS — Z510 Encounter for antineoplastic radiation therapy: Secondary | ICD-10-CM | POA: Diagnosis not present

## 2015-05-02 ENCOUNTER — Ambulatory Visit
Admission: RE | Admit: 2015-05-02 | Discharge: 2015-05-02 | Disposition: A | Discharge status: Home / Self Care | Payer: 59 | Source: Ambulatory Visit | Attending: Radiation Oncology | Admitting: Radiation Oncology

## 2015-05-02 ENCOUNTER — Encounter: Payer: Self-pay | Admitting: Radiation Oncology

## 2015-05-02 ENCOUNTER — Ambulatory Visit: Payer: 59

## 2015-05-02 VITALS — BP 119/72 | HR 53 | Temp 98.1°F | Ht 62.0 in | Wt 120.1 lb

## 2015-05-02 DIAGNOSIS — Z510 Encounter for antineoplastic radiation therapy: Secondary | ICD-10-CM | POA: Diagnosis not present

## 2015-05-02 DIAGNOSIS — C50311 Malignant neoplasm of lower-inner quadrant of right female breast: Secondary | ICD-10-CM | POA: Insufficient documentation

## 2015-05-02 MED ORDER — RADIAPLEXRX EX GEL
Freq: Once | CUTANEOUS | Status: AC
  Administered 2015-05-02: 14:00:00 via TOPICAL

## 2015-05-02 MED ORDER — ALRA NON-METALLIC DEODORANT (RAD-ONC)
1.0000 "application " | Freq: Once | TOPICAL | Status: AC
  Administered 2015-05-02: 1 via TOPICAL

## 2015-05-02 NOTE — Progress Notes (Signed)
Weekly Management Note:  Site: right breast Current Dose:   1064  cGy Projected Dose:  4256  cGy  Narrative: The patient is seen today for routine under treatment assessment. CBCT/MVCT images/port films were reviewed. The chart was reviewed.    She is without complaints today. She has Radioplex gel to use as needed.  Physical Examination:  Filed Vitals:   05/02/15 1324  BP: 119/72  Pulse: 53  Temp: 98.1 F (36.7 C)  .  Weight: 120 lb 1.6 oz (54.477 kg).  There are no significant skin changes along the right breast.  Impression: Tolerating radiation therapy well.  Plan: Continue radiation therapy as planned.

## 2015-05-02 NOTE — Progress Notes (Signed)
Pt here for patient teaching.  Pt given Radiation and You booklet, skin care instructions, Alra deodorant and Radiaplex gel. Pt reports she has not watched  the Radiation Therapy Education video on. Reviewed areas of pertinence such as fatigue, skin changes, breast tenderness and breast swelling . Pt able to give teach back of to pat skin, use unscented/gentle soap and drink plenty of water,apply Radiaplex bid, avoid applying anything to skin within 4 hours of treatment, avoid wearing an under wire bra and to use an electric razor if they must shave. Pt demonstrated understanding of information given and will contact nursing with any questions or concerns.     Http://rtanswers.org/treatmentinformation/whattoexpect/index - Given link to watch

## 2015-05-02 NOTE — Progress Notes (Signed)
Amanda Li

## 2015-05-03 ENCOUNTER — Ambulatory Visit
Admission: RE | Admit: 2015-05-03 | Discharge: 2015-05-03 | Disposition: A | Discharge status: Home / Self Care | Payer: 59 | Source: Ambulatory Visit | Attending: Radiation Oncology | Admitting: Radiation Oncology

## 2015-05-03 DIAGNOSIS — Z510 Encounter for antineoplastic radiation therapy: Secondary | ICD-10-CM | POA: Diagnosis not present

## 2015-05-04 ENCOUNTER — Ambulatory Visit
Admission: RE | Admit: 2015-05-04 | Discharge: 2015-05-04 | Disposition: A | Discharge status: Home / Self Care | Payer: 59 | Source: Ambulatory Visit | Attending: Radiation Oncology | Admitting: Radiation Oncology

## 2015-05-04 DIAGNOSIS — Z510 Encounter for antineoplastic radiation therapy: Secondary | ICD-10-CM | POA: Diagnosis not present

## 2015-05-05 ENCOUNTER — Ambulatory Visit
Admission: RE | Admit: 2015-05-05 | Discharge: 2015-05-05 | Disposition: A | Discharge status: Home / Self Care | Payer: 59 | Source: Ambulatory Visit | Attending: Radiation Oncology | Admitting: Radiation Oncology

## 2015-05-05 DIAGNOSIS — Z510 Encounter for antineoplastic radiation therapy: Secondary | ICD-10-CM | POA: Diagnosis not present

## 2015-05-06 ENCOUNTER — Ambulatory Visit
Admission: RE | Admit: 2015-05-06 | Discharge: 2015-05-06 | Disposition: A | Discharge status: Home / Self Care | Payer: 59 | Source: Ambulatory Visit | Attending: Radiation Oncology | Admitting: Radiation Oncology

## 2015-05-06 DIAGNOSIS — Z510 Encounter for antineoplastic radiation therapy: Secondary | ICD-10-CM | POA: Diagnosis not present

## 2015-05-09 ENCOUNTER — Ambulatory Visit
Admission: RE | Admit: 2015-05-09 | Discharge: 2015-05-09 | Disposition: A | Discharge status: Home / Self Care | Payer: 59 | Source: Ambulatory Visit | Attending: Radiation Oncology | Admitting: Radiation Oncology

## 2015-05-09 ENCOUNTER — Encounter: Payer: Self-pay | Admitting: Radiation Oncology

## 2015-05-09 ENCOUNTER — Telehealth: Payer: Self-pay | Admitting: *Deleted

## 2015-05-09 VITALS — BP 119/81 | HR 53 | Temp 98.8°F | Ht 62.0 in | Wt 119.1 lb

## 2015-05-09 DIAGNOSIS — C50311 Malignant neoplasm of lower-inner quadrant of right female breast: Secondary | ICD-10-CM

## 2015-05-09 DIAGNOSIS — Z510 Encounter for antineoplastic radiation therapy: Secondary | ICD-10-CM | POA: Diagnosis not present

## 2015-05-09 NOTE — Telephone Encounter (Signed)
Left message for a return phone to follow up after start of radiation.  Awaiting patient response.

## 2015-05-09 NOTE — Progress Notes (Signed)
Weekly Management Note:  Site: Right breast Current Dose:  2394  cGy Projected Dose: 4256  cGy  Narrative: The patient is seen today for routine under treatment assessment. CBCT/MVCT images/port films were reviewed. The chart was reviewed.   She is without complaints today.  She does admit  to mild fatigue, questionably related to multiple sclerosis versus radiation therapy.  She uses Radioplex gel.  Physical Examination:  Filed Vitals:   05/09/15 1137  BP: 119/81  Pulse: 53  Temp: 98.8 F (37.1 C)  .  Weight: 119 lb 1.6 oz (54.023 kg).  There is faint erythema along the right breast with no areas of desquamation.  Impression: Tolerating radiation therapy well.  Plan: Continue radiation therapy as planned.

## 2015-05-09 NOTE — Progress Notes (Signed)
Ms. Plett  Has received 9 fractions to her right breast.  Note mild erythema in the upper, inner portion of her breast and in the lower axillary region.  She notes soreness of her breast. Fatigue noted but not sure it is related to treatment or her MS.

## 2015-05-10 ENCOUNTER — Encounter: Payer: Self-pay | Admitting: Radiation Oncology

## 2015-05-10 ENCOUNTER — Ambulatory Visit
Admission: RE | Admit: 2015-05-10 | Discharge: 2015-05-10 | Disposition: A | Discharge status: Home / Self Care | Payer: 59 | Source: Ambulatory Visit | Attending: Radiation Oncology | Admitting: Radiation Oncology

## 2015-05-10 DIAGNOSIS — Z510 Encounter for antineoplastic radiation therapy: Secondary | ICD-10-CM | POA: Diagnosis not present

## 2015-05-11 ENCOUNTER — Ambulatory Visit
Admission: RE | Admit: 2015-05-11 | Discharge: 2015-05-11 | Disposition: A | Discharge status: Home / Self Care | Payer: 59 | Source: Ambulatory Visit | Attending: Radiation Oncology | Admitting: Radiation Oncology

## 2015-05-11 DIAGNOSIS — Z510 Encounter for antineoplastic radiation therapy: Secondary | ICD-10-CM | POA: Diagnosis not present

## 2015-05-12 ENCOUNTER — Ambulatory Visit
Admission: RE | Admit: 2015-05-12 | Discharge: 2015-05-12 | Disposition: A | Discharge status: Home / Self Care | Payer: 59 | Source: Ambulatory Visit | Attending: Radiation Oncology | Admitting: Radiation Oncology

## 2015-05-12 DIAGNOSIS — Z510 Encounter for antineoplastic radiation therapy: Secondary | ICD-10-CM | POA: Diagnosis not present

## 2015-05-13 ENCOUNTER — Ambulatory Visit
Admission: RE | Admit: 2015-05-13 | Discharge: 2015-05-13 | Disposition: A | Discharge status: Home / Self Care | Payer: 59 | Source: Ambulatory Visit | Attending: Radiation Oncology | Admitting: Radiation Oncology

## 2015-05-13 DIAGNOSIS — C50311 Malignant neoplasm of lower-inner quadrant of right female breast: Secondary | ICD-10-CM

## 2015-05-13 DIAGNOSIS — Z510 Encounter for antineoplastic radiation therapy: Secondary | ICD-10-CM | POA: Diagnosis not present

## 2015-05-13 NOTE — Progress Notes (Signed)
   Department of Radiation Oncology  Phone:  [phone] Fax:        [phone]  Weekly Treatment Note    Name: Amanda Li Date: 05/13/2015 MRN: [id number] DOB: [birth year]   Diagnosis:     ICD-9-CM ICD-10-CM   1. Breast cancer of lower-inner quadrant of right female breast ([place]) 174.3 C50.311      Current dose: 34.58 Gy  Current fraction:13   MEDICATIONS: Current Outpatient Prescriptions  Medication Sig Dispense Refill  . aspirin 81 MG tablet Take 81 mg by mouth daily.    . baclofen (LIORESAL) 20 MG tablet Take 20 mg by mouth 3 (three) times daily.     Marland Kitchen buPROPion (WELLBUTRIN XL) 150 MG 24 hr tablet Take 1 tablet by mouth daily.  6  . Cholecalciferol (PA VITAMIN D-3) 2000 UNITS CAPS Take 4,000 Units by mouth daily.     . diazepam (VALIUM) 5 MG tablet TK 1 T PO NIGHTLY PRN FOR ANXIETY  0  . DULoxetine (CYMBALTA) 60 MG capsule Take 60 mg by mouth daily.     . fexofenadine-pseudoephedrine (ALLEGRA-D 24) 180-240 MG 24 hr tablet Take 1 tablet by mouth daily.    . fluticasone (FLONASE) 50 MCG/ACT nasal spray 1  spray in each nostril daily as needed.  11  . levothyroxine (SYNTHROID, LEVOTHROID) 100 MCG tablet Take 100 mcg by mouth daily.     . mirabegron ER (MYRBETRIQ) 25 MG TB24 tablet Take 25 mg by mouth daily.     . pseudoephedrine (SUDAFED) 30 MG tablet Take 30 mg by mouth every 4 (four) hours as needed for congestion.     No current facility-administered medications for this encounter.     ALLERGIES: Fentanyl and Penicillins   LABORATORY DATA:  No results found for: WBC, HGB, HCT, MCV, PLT No results found for: NA, K, CL, CO2 No results found for: ALT, AST, GGT, ALKPHOS, BILITOT   NARRATIVE: Amanda Li was seen today for weekly treatment management. The chart was checked and the patient's films were reviewed.  Amanda Li has received 13 fractions. Reports that her appetite is good. Energy level is down, but does not feel it's because of the radiation.  She has MS and she has been Christmas shopping. Skin to the right breast with slight dark pink, denies itching or tenderness, using Radiplex to the right breast usually once a day. She is scheduled to follow up with med/onc in mid-January.  PHYSICAL EXAMINATION: vitals were not taken for this visit. Mild erythema present in the treatment area without desquamation.  ASSESSMENT: The patient is doing satisfactorily with treatment.  PLAN: We will continue with the patient's radiation treatment as planned.  This document serves as a record of services personally performed by Kyung Rudd, MD. It was created on his behalf by Darcus Austin, a trained medical scribe. The creation of this record is based on the scribe's personal observations and the provider's statements to them. This document has been checked and approved by the attending provider.

## 2015-05-13 NOTE — Progress Notes (Signed)
Mrs. Crunkleton has received 13 fractions.  Reports that her appetite is good.  Energy level down does not feel it's because of the radiation has MS and she has been Christmas shopping.  Skin to right breast with slight dark pink, denies itching or tenderness, using Radiplex to the right breast usually once a day.  Will see medical oncologist early in the new year.

## 2015-05-17 ENCOUNTER — Ambulatory Visit
Admission: RE | Admit: 2015-05-17 | Discharge: 2015-05-17 | Disposition: A | Discharge status: Home / Self Care | Payer: 59 | Source: Ambulatory Visit | Attending: Radiation Oncology | Admitting: Radiation Oncology

## 2015-05-17 DIAGNOSIS — Z510 Encounter for antineoplastic radiation therapy: Secondary | ICD-10-CM | POA: Diagnosis not present

## 2015-05-18 ENCOUNTER — Ambulatory Visit
Admission: RE | Admit: 2015-05-18 | Discharge: 2015-05-18 | Disposition: A | Discharge status: Home / Self Care | Payer: 59 | Source: Ambulatory Visit | Attending: Radiation Oncology | Admitting: Radiation Oncology

## 2015-05-18 ENCOUNTER — Ambulatory Visit: Payer: 59 | Admitting: Radiation Oncology

## 2015-05-18 DIAGNOSIS — Z510 Encounter for antineoplastic radiation therapy: Secondary | ICD-10-CM | POA: Diagnosis not present

## 2015-05-19 ENCOUNTER — Ambulatory Visit
Admission: RE | Admit: 2015-05-19 | Discharge: 2015-05-19 | Disposition: A | Discharge status: Home / Self Care | Payer: 59 | Source: Ambulatory Visit | Attending: Radiation Oncology | Admitting: Radiation Oncology

## 2015-05-19 ENCOUNTER — Encounter: Payer: Self-pay | Admitting: Radiation Oncology

## 2015-05-19 ENCOUNTER — Ambulatory Visit: Payer: 59

## 2015-05-19 DIAGNOSIS — C50311 Malignant neoplasm of lower-inner quadrant of right female breast: Secondary | ICD-10-CM

## 2015-05-19 DIAGNOSIS — Z510 Encounter for antineoplastic radiation therapy: Secondary | ICD-10-CM | POA: Diagnosis not present

## 2015-05-19 NOTE — Progress Notes (Signed)
  Radiation Oncology         [phone] ________________________________  Name: Amanda Li MRN: [id number]  Date: 05/19/2015  DOB: July 09, 1955  Weekly Radiation Therapy Management    ICD-9-CM ICD-10-CM   1. Breast cancer of lower-inner quadrant of right female breast ([facility]) 174.3 C50.311     Current Dose: 42.56 Gy     Planned Dose:  42.56 Gy  Narrative . . . . . . . . The patient received her final radiation treatment today.                                           The patient was without complaint.                                 Set-up films were reviewed.                                 The chart was checked. Physical Findings. . . Weight essentially stable.  No significant changes. Impression . . . . . . . The patient tolerated radiation relatively well. Plan . . . . . . . . . . . . Complete radiation today as scheduled, and follow-up in one month. The patient was encouraged to call or return to the clinic in the interim for any worsening symptoms.  ________________________________  Sheral Apley Tammi Klippel, M.D.  This document serves as a record of services personally performed by Tyler Pita, MD. It was created on his behalf by Arlyce Harman, a trained medical scribe. The creation of this record is based on the scribe's personal observations and the provider's statements to them. This document has been checked and approved by the attending provider.

## 2015-05-20 ENCOUNTER — Ambulatory Visit: Payer: 59

## 2015-05-24 ENCOUNTER — Ambulatory Visit: Payer: 59

## 2015-05-24 ENCOUNTER — Telehealth: Payer: Self-pay | Admitting: *Deleted

## 2015-05-24 ENCOUNTER — Other Ambulatory Visit: Payer: Self-pay | Admitting: Adult Health

## 2015-05-24 DIAGNOSIS — C50311 Malignant neoplasm of lower-inner quadrant of right female breast: Secondary | ICD-10-CM

## 2015-05-24 NOTE — Telephone Encounter (Signed)
Attempted to call patient follow up after radiation completion.  No answer and no voicemail.

## 2015-05-25 ENCOUNTER — Telehealth: Payer: Self-pay | Admitting: Nurse Practitioner

## 2015-05-25 ENCOUNTER — Ambulatory Visit: Payer: 59

## 2015-05-25 NOTE — Telephone Encounter (Signed)
Left messaege for patient re 2/24 SCP visit and also confirmed 1/17 f/u w/YF. Schedule mailed.

## 2015-06-05 ENCOUNTER — Telehealth: Payer: Self-pay | Admitting: Hematology

## 2015-06-05 NOTE — Telephone Encounter (Signed)
Due to PAL - moved 1/17 f/u to 1/18. Left message for patient.

## 2015-06-07 ENCOUNTER — Telehealth: Payer: Self-pay | Admitting: Hematology

## 2015-06-07 ENCOUNTER — Ambulatory Visit: Payer: 59 | Admitting: Hematology

## 2015-06-07 NOTE — Telephone Encounter (Signed)
pt cld left voicemail ot r/s appt-cld pt and r/s appt to 2/3-pt had r/s time & date

## 2015-06-08 ENCOUNTER — Ambulatory Visit: Payer: 59 | Admitting: Hematology

## 2015-06-22 NOTE — Progress Notes (Signed)
  Radiation Oncology         [phone] ________________________________  Name: Amanda Li MRN: [id number]  Date: 05/19/2015  DOB: 1956/03/31  End of Treatment Note  Diagnosis:   Right-sided breast cancer     Indication for treatment:  Curative       Radiation treatment dates:   04/27/2015 through 05/19/2015  Site/dose:   The patient received a dose of 42.5 Gy in 17 fractions to the breast using whole-breast tangent fields. This was delivered using a 3-D conformal technique.   Narrative: The patient tolerated radiation treatment relatively well.   The patient had some expected skin irritation as she progressed during treatment. Moist desquamation was not present at the end of treatment.  Plan: The patient has completed radiation treatment. The patient will return to radiation oncology clinic for routine followup in one month. I advised the patient to call or return sooner if they have any questions or concerns related to their recovery or treatment. ________________________________  Jodelle Gross, M.D., Ph.D.

## 2015-06-24 ENCOUNTER — Ambulatory Visit
Admission: RE | Admit: 2015-06-24 | Discharge: 2015-06-24 | Disposition: A | Discharge status: Home / Self Care | Payer: 59 | Source: Ambulatory Visit | Attending: Radiation Oncology | Admitting: Radiation Oncology

## 2015-06-24 ENCOUNTER — Encounter: Payer: Self-pay | Admitting: Hematology

## 2015-06-24 ENCOUNTER — Telehealth: Payer: Self-pay | Admitting: *Deleted

## 2015-06-24 ENCOUNTER — Encounter: Payer: Self-pay | Admitting: Radiation Oncology

## 2015-06-24 ENCOUNTER — Ambulatory Visit: Payer: 59 | Admitting: Hematology

## 2015-06-24 VITALS — BP 124/89 | HR 48 | Temp 98.2°F | Resp 20 | Ht 62.0 in | Wt 117.7 lb

## 2015-06-24 DIAGNOSIS — C50311 Malignant neoplasm of lower-inner quadrant of right female breast: Secondary | ICD-10-CM

## 2015-06-24 NOTE — Progress Notes (Signed)
Radiation Oncology         [phone] ________________________________  Name: Amanda Li MRN: [id number]  Date: 06/24/2015  DOB: 1955-10-17  Follow-Up Visit Note  CC: Marton Redwood, MD  Autumn Messing III, MD  Diagnosis:   Right-sided breast cancer  Breast cancer of lower-inner quadrant of right female breast [facility])   Staging form: Breast, AJCC 7th Edition     Clinical stage from 02/28/2015: Stage 0 (Tis (DCIS), N0, M0) - Signed by Truitt Merle, MD on 02/28/2015    Interval Since Last Radiation:  Approximately one month   Narrative:  The patient returns today for routine follow-up.  She has done well overall since she finished treatment. The patient's skin has healed significantly since she completed her course of radiation treatment. She has not begun anti-hormonal treatment.  The patient has discussed this with medical oncology and due to her comorbidities the decision has been made not to undergo anti-hormonal treatment.                            ALLERGIES:  is allergic to fentanyl and penicillins.  Meds: Current Outpatient Prescriptions  Medication Sig Dispense Refill  . aspirin 81 MG tablet Take 81 mg by mouth daily.    . baclofen (LIORESAL) 20 MG tablet Take 20 mg by mouth 3 (three) times daily.     Marland Kitchen buPROPion (WELLBUTRIN XL) 150 MG 24 hr tablet Take 1 tablet by mouth daily.  6  . Cholecalciferol (PA VITAMIN D-3) 2000 UNITS CAPS Take 4,000 Units by mouth daily.     . diazepam (VALIUM) 5 MG tablet TK 1 T PO NIGHTLY PRN FOR ANXIETY  0  . DULoxetine (CYMBALTA) 60 MG capsule Take 60 mg by mouth daily.     . fluticasone (FLONASE) 50 MCG/ACT nasal spray 1  spray in each nostril daily as needed.  11  . levothyroxine (SYNTHROID, LEVOTHROID) 100 MCG tablet Take 100 mcg by mouth daily.     . mirabegron ER (MYRBETRIQ) 25 MG TB24 tablet Take 25 mg by mouth daily.     . propranolol (INDERAL) 20 MG tablet Take 20 mg by mouth 2 (two) times daily.    . pseudoephedrine (SUDAFED) 30 MG tablet  Take 30 mg by mouth every 4 (four) hours as needed for congestion.    Marland Kitchen acetaminophen (TYLENOL) 500 MG tablet Take 500 mg by mouth daily as needed.    . fexofenadine-pseudoephedrine (ALLEGRA-D 24) 180-240 MG 24 hr tablet Take 1 tablet by mouth daily. Reported on 06/24/2015     No current facility-administered medications for this encounter.    Physical Findings: The patient is in no acute distress. Patient is alert and oriented.  height is 5\' 2"  (1.575 m) and weight is 117 lb 11.2 oz (53.388 kg). Her oral temperature is 98.2 F (36.8 C). Her blood pressure is 124/89 and her pulse is 48. Her respiration is 20. .   The skin in the treatment area has healed satisfactorily, no areas of concern/moist desquamation/poor healing  Lab Findings: No results found for: WBC, HGB, HCT, MCV, PLT   Radiographic Findings: No results found.  Impression:    The patient has done satisfactorily since finishing treatment. She will not receive anti-hormonal treatment  Plan:  The patient will followup in our clinic in 6 months.   Jodelle Gross, M.D., Ph.D.

## 2015-06-24 NOTE — Telephone Encounter (Signed)
Notified pt that she doesn't need to come today if she doesn't want/need to see Dr Burr Medico with Dx of DCIS.  Pt has appt with Dr Lisbeth Renshaw today & will keep that appt.  She also asked about Survivorship appt with Heather & this was explained to her.  She really doesn't think she needs that either.  Discussed with Chestine Spore NP & she will send information in the mail to pt & appt can be made after that if she would like more info.  Pt is in agreement with this & appt for Nira Conn will be cancelled for now.

## 2015-06-24 NOTE — Progress Notes (Addendum)
Follow up s/p right breast radiation completed 05/19/15, well healed,  Still using radiaplex  On breast, started propanolol  For her tremors from [place]  06/22/15,  Appetite good, energy level okay declined to keep appt survivorship doesn't feel she needs this 2:15 PM BP 124/89 mmHg  Pulse 48  Temp(Src) 98.2 F (36.8 C) (Oral)  Resp 20  Ht 5\' 2"  (1.575 m)  Wt 117 lb 11.2 oz (53.388 kg)  BMI 21.52 kg/m2  Wt Readings from Last 3 Encounters:  06/24/15 117 lb 11.2 oz (53.388 kg)  05/09/15 119 lb 1.6 oz (54.023 kg)  05/02/15 120 lb 1.6 oz (54.477 kg)   Gaspar Garbe, RN II

## 2015-07-06 ENCOUNTER — Encounter: Payer: Self-pay | Admitting: Nurse Practitioner

## 2015-07-06 DIAGNOSIS — C50311 Malignant neoplasm of lower-inner quadrant of right female breast: Secondary | ICD-10-CM

## 2015-07-06 NOTE — Progress Notes (Signed)
The Survivorship Care Plan was mailed to Amanda Li as she reported not being able to come in to the Survivorship Clinic for an in-person visit at this time. A letter was mailed to her outlining the purpose of the content of the care plan, as well as encouraging her to reach out to me with any questions or concerns.  My business card was included in the correspondence to the patient as well.  A copy of the care plan was also routed/faxed/mailed to Marton Redwood, MD, the patient's PCP.  I will not be placing any follow-up appointments to the Survivorship Clinic for Amanda Li, but I am happy to see her at any time in the future for any survivorship concerns that may arise. Thank you for allowing me to participate in her care!  Kenn File, [place] [phone]

## 2015-07-15 ENCOUNTER — Encounter: Payer: 59 | Admitting: Nurse Practitioner

## 2015-12-06 ENCOUNTER — Telehealth: Payer: Self-pay | Admitting: *Deleted

## 2015-12-06 NOTE — Telephone Encounter (Signed)
CALLED PATIENT TO ASK ABOUT MOVING FU TO 12-13-15 WITH ALISON PERKINS, LVM FOR A RETURN CALL

## 2015-12-22 ENCOUNTER — Ambulatory Visit: Admission: RE | Admit: 2015-12-22 | Payer: 59 | Source: Ambulatory Visit | Admitting: Radiation Oncology

## 2016-01-18 ENCOUNTER — Ambulatory Visit
Admission: RE | Admit: 2016-01-18 | Discharge: 2016-01-18 | Disposition: A | Discharge status: Home / Self Care | Payer: 59 | Source: Ambulatory Visit | Attending: Radiation Oncology | Admitting: Radiation Oncology

## 2016-01-18 ENCOUNTER — Encounter: Payer: Self-pay | Admitting: Radiation Oncology

## 2016-01-18 DIAGNOSIS — C50311 Malignant neoplasm of lower-inner quadrant of right female breast: Secondary | ICD-10-CM | POA: Insufficient documentation

## 2016-01-18 NOTE — Progress Notes (Signed)
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  Patient has no complaints of pain. She notes her appetite is good. She states her skin in the treatment area is doing well, and she denies noticing any new bumps on her breast. She also notes a fair energy level. Her energy level occasionally decreases because of her MS. Her last mammogram was 02/15/15.                         ALLERGIES:  is allergic to fentanyl and penicillins.  Meds: Current Outpatient Prescriptions  Medication Sig Dispense Refill  . acetaminophen (TYLENOL) 500 MG tablet Take 500 mg by mouth daily as needed.    Marland Kitchen aspirin 81 MG tablet Take 81 mg by mouth daily.    . baclofen (LIORESAL) 20 MG tablet Take 20 mg by mouth 3 (three) times daily.     Marland Kitchen buPROPion (WELLBUTRIN XL) 150 MG 24 hr tablet Take 1 tablet by mouth daily.  6  . Cholecalciferol (PA VITAMIN D-3) 2000 UNITS CAPS Take 4,000 Units by mouth daily.     . diazepam (VALIUM) 5 MG tablet TK 1 T PO NIGHTLY PRN FOR ANXIETY  0  . DULoxetine (CYMBALTA) 60 MG capsule Take 60 mg by mouth daily.     Marland Kitchen levothyroxine (SYNTHROID, LEVOTHROID) 100 MCG tablet Take 100 mcg by mouth daily.     . mirabegron ER (MYRBETRIQ) 25 MG TB24 tablet Take 25 mg by mouth daily.     . propranolol (INDERAL) 20 MG tablet Take 20 mg by mouth 2 (two) times daily.    . calcium elemental as carbonate (BARIATRIC TUMS ULTRA) 400 MG chewable tablet Chew 400 mg by mouth as needed.    . fexofenadine-pseudoephedrine (ALLEGRA-D 24) 180-240 MG 24 hr tablet Take 1 tablet by mouth daily. Reported on 06/24/2015    . fluticasone (FLONASE) 50 MCG/ACT nasal  spray 1  spray in each nostril daily as needed.  11  . naproxen sodium (ANAPROX) 220 MG tablet Take 220 mg by mouth as needed.    . pseudoephedrine (SUDAFED) 30 MG tablet Take 30 mg by mouth every 4 (four) hours as needed for congestion.     No current facility-administered medications for this encounter.     Physical Findings: The patient is in no acute distress. Patient is alert and oriented.  height is 5\' 2"  (1.575 m) and weight is 115 lb 1.6 oz (52.2 kg). Her oral temperature is 98.1 F (36.7 C). Her blood pressure is 120/73 and her pulse is 59 (abnormal).   Lungs were clear to auscultation. Heart had normal rate and rhythm. Normal bowel sounds. No suspicious masses in either breast and no axilary adenopathy.  Lab Findings: No results found for: WBC, HGB, HCT, MCV, PLT   Radiographic Findings: No results found.  Impression:  Patient clinically doing well, no concerning findings today.  Plan:  Will continue routine follow up in 6 months.     Jodelle Gross, M.D., Ph.D.    This document serves as a record of services personally performed by Kyung Rudd, MD. It was created  on his behalf by Bethann Humble, a trained medical scribe. The creation of this record is based on the scribe's personal observations and the provider's statements to them. This document has been checked and approved by the attending provider.

## 2016-01-18 NOTE — Progress Notes (Signed)
Follow up s/p right breast radiation completed 05/19/15, well healed breast, no c/o pain, appetite good, last mammogram 02/15/15, hasn;t scheduled  Mammogram will let her Primary MD do stated, energy level  Fair sometimes not so good because of her MS 2:06 PM'. BP 120/73 (BP Location: Left Arm, Patient Position: Sitting, Cuff Size: Normal)   Pulse (!) 59   Temp 98.1 F (36.7 C) (Oral)   Ht 5\' 2"  (1.575 m)   Wt 115 lb 1.6 oz (52.2 kg)   BMI 21.05 kg/m   Wt Readings from Last 3 Encounters:  01/18/16 115 lb 1.6 oz (52.2 kg)  06/24/15 117 lb 11.2 oz (53.4 kg)  05/09/15 119 lb 1.6 oz (54 kg)

## 2016-02-27 ENCOUNTER — Other Ambulatory Visit: Payer: Self-pay | Admitting: Internal Medicine

## 2016-02-27 DIAGNOSIS — Z853 Personal history of malignant neoplasm of breast: Secondary | ICD-10-CM

## 2016-03-07 ENCOUNTER — Ambulatory Visit
Admission: RE | Admit: 2016-03-07 | Discharge: 2016-03-07 | Disposition: A | Discharge status: Home / Self Care | Payer: 59 | Source: Ambulatory Visit | Attending: Internal Medicine | Admitting: Internal Medicine

## 2016-03-07 DIAGNOSIS — Z853 Personal history of malignant neoplasm of breast: Secondary | ICD-10-CM

## 2016-06-19 DIAGNOSIS — G8929 Other chronic pain: Secondary | ICD-10-CM | POA: Diagnosis not present

## 2016-06-19 DIAGNOSIS — M25561 Pain in right knee: Secondary | ICD-10-CM | POA: Diagnosis not present

## 2016-06-19 DIAGNOSIS — S83281D Other tear of lateral meniscus, current injury, right knee, subsequent encounter: Secondary | ICD-10-CM | POA: Diagnosis not present

## 2016-07-11 ENCOUNTER — Ambulatory Visit
Admission: RE | Admit: 2016-07-11 | Discharge: 2016-07-11 | Disposition: A | Discharge status: Home / Self Care | Payer: 59 | Source: Ambulatory Visit | Attending: Radiation Oncology | Admitting: Radiation Oncology

## 2016-07-11 ENCOUNTER — Encounter: Payer: Self-pay | Admitting: Radiation Oncology

## 2016-07-11 VITALS — BP 117/69 | HR 59 | Temp 98.1°F | Resp 16 | Ht 62.0 in | Wt 111.8 lb

## 2016-07-11 DIAGNOSIS — C50911 Malignant neoplasm of unspecified site of right female breast: Secondary | ICD-10-CM | POA: Insufficient documentation

## 2016-07-11 DIAGNOSIS — Z86000 Personal history of in-situ neoplasm of breast: Secondary | ICD-10-CM | POA: Diagnosis not present

## 2016-07-11 DIAGNOSIS — Z08 Encounter for follow-up examination after completed treatment for malignant neoplasm: Secondary | ICD-10-CM | POA: Diagnosis not present

## 2016-07-11 DIAGNOSIS — Z880 Allergy status to penicillin: Secondary | ICD-10-CM | POA: Insufficient documentation

## 2016-07-11 DIAGNOSIS — Z170 Estrogen receptor positive status [ER+]: Secondary | ICD-10-CM | POA: Insufficient documentation

## 2016-07-11 DIAGNOSIS — Z79899 Other long term (current) drug therapy: Secondary | ICD-10-CM | POA: Diagnosis not present

## 2016-07-11 DIAGNOSIS — Z7982 Long term (current) use of aspirin: Secondary | ICD-10-CM | POA: Diagnosis not present

## 2016-07-11 DIAGNOSIS — C50311 Malignant neoplasm of lower-inner quadrant of right female breast: Secondary | ICD-10-CM

## 2016-07-11 NOTE — Progress Notes (Signed)
Radiation Oncology         [phone] ________________________________  Name: Amanda Li MRN: [id number]  Date: 07/11/2016  DOB: 12-28-1955  Follow-Up Visit Note  CC: Marton Redwood, MD  Autumn Messing III, MD  Diagnosis:   ER/PR positive DCIS of the right breast  Interval Since Last Radiation: 13 months  04/27/2015 through 05/19/2015:The patient received a dose of 42.5 Gy in 17 fractions to the breast using whole-breast tangent fields. This was delivered using a 3-D conformal technique.  Narrative:  The patient returns today for routine follow-up. She was diagnosed with DCIS of the right breast in 2016 and was treated with lumpectomy, adjuvant radiotherapy, and was counseled against antiestrogen therapy due to her history of MS. She returns today for routine evaluation of her breast cancer. She has previously discussed survivorship with Chestine Spore, NP, however has declined additional surveillance.  On review of systems, the patient reports that she  is doing well overall. She denies any chest pain, shortness of breath, cough, fevers, chills, night sweats, unintended weight changes. She denies any palpable breast mass, and denies any bowel or bladder disturbances, and denies abdominal pain, nausea or vomiting. She denies any new musculoskeletal or joint aches or pains, new skin lesions or concerns. A complete review of systems is obtained and is otherwise negative.   Past Medical History:  Past Medical History:  Diagnosis Date  . Allergy    seasonal  . Dental crowns present   . Depression   . Hypothyroidism   . Joint pain 02/2015   legs  . Multiple sclerosis ([place])   . Thyroid disease    hypothyroid  . Urinary frequency     Past Surgical History: Past Surgical History:  Procedure Laterality Date  . BREAST LUMPECTOMY WITH RADIOACTIVE SEED LOCALIZATION Right 03/25/2015   Procedure: BREAST LUMPECTOMY WITH RADIOACTIVE SEED LOCALIZATION;  Surgeon: Autumn Messing III, MD;   Location: [place];  Service: General;  Laterality: Right;  . CESAREAN SECTION     x 2  . COLONOSCOPY WITH PROPOFOL  03/25/2014  . LAPAROSCOPIC ABDOMINAL EXPLORATION      Social History:  Social History   Social History  . Marital status: Married    Spouse name: N/A  . Number of children: N/A  . Years of education: N/A   Occupational History  . Not on file.   Social History Main Topics  . Smoking status: Never Smoker  . Smokeless tobacco: Never Used  . Alcohol use 4.2 oz/week    7 Standard drinks or equivalent per week     Comment: wine with dinner daily  . Drug use: No  . Sexual activity: Not on file   Other Topics Concern  . Not on file   Social History Narrative  . No narrative on file    Family History: Family History  Problem Relation Age of Onset  . Heart disease Mother   . Lung cancer Father     ALLERGIES:  is allergic to fentanyl and penicillins.  Meds: Current Outpatient Prescriptions  Medication Sig Dispense Refill  . acetaminophen (TYLENOL) 500 MG tablet Take 500 mg by mouth daily as needed.    Marland Kitchen aspirin 81 MG tablet Take 81 mg by mouth daily.    . baclofen (LIORESAL) 20 MG tablet Take 20 mg by mouth 3 (three) times daily.     Marland Kitchen buPROPion (WELLBUTRIN XL) 150 MG 24 hr tablet Take 1 tablet by mouth daily.  6  . calcium elemental  as carbonate (BARIATRIC TUMS ULTRA) 400 MG chewable tablet Chew 400 mg by mouth as needed.    . Cholecalciferol (PA VITAMIN D-3) 2000 UNITS CAPS Take 4,000 Units by mouth daily.     . diazepam (VALIUM) 5 MG tablet TK 1 T PO NIGHTLY PRN FOR ANXIETY  0  . DULoxetine (CYMBALTA) 60 MG capsule Take 60 mg by mouth daily.     Marland Kitchen levothyroxine (SYNTHROID, LEVOTHROID) 100 MCG tablet Take 100 mcg by mouth daily.     . fexofenadine-pseudoephedrine (ALLEGRA-D 24) 180-240 MG 24 hr tablet Take 1 tablet by mouth daily. Reported on 06/24/2015    . fluticasone (FLONASE) 50 MCG/ACT nasal spray 1  spray in each nostril daily as  needed.  11  . naproxen sodium (ANAPROX) 220 MG tablet Take 220 mg by mouth as needed.    . propranolol (INDERAL) 20 MG tablet Take 20 mg by mouth 2 (two) times daily.    . pseudoephedrine (SUDAFED) 30 MG tablet Take 30 mg by mouth every 4 (four) hours as needed for congestion.     No current facility-administered medications for this encounter.     Physical Findings:  height is 5\' 2"  (1.575 m) and weight is 111 lb 12.8 oz (50.7 kg). Her oral temperature is 98.1 F (36.7 C). Her blood pressure is 117/69 and her pulse is 59 (abnormal). Her respiration is 16 and oxygen saturation is 100%.   In general this is a well appearing caucasian female in no acute distress. She is alert and oriented x4 and appropriate throughout the examination. HEENT reveals that the patient is normocephalic, atraumatic. EOMs are intact. PERRLA. Skin is intact without any evidence of gross lesions. Cardiovascular exam reveals a regular rate and rhythm, no clicks rubs or murmurs are auscultated. Chest is clear to auscultation bilaterally. Lymphatic assessment is performed and does not reveal any adenopathy in the cervical, supraclavicular, axillary, or inguinal chains. Abdomen has active bowel sounds in all quadrants and is intact. Bilateral breast examination reveals post surgical changes of the right breast consistent with prior lumpectomy, as well as hypopigmentation of the right breast. No palpable masses, or visible asymmetry, skin changes, nipple bleeding, or discharge is noted of either breast. The abdomen is soft, non tender, non distended. Lower extremities are negative for pretibial pitting edema, deep calf tenderness, cyanosis or clubbing.   Lab Findings: No results found for: WBC, HGB, HCT, MCV, PLT   Radiographic Findings: No results found.  Impression/Plan: 1. ER/PR positive DCIS of the right breast. The patient appears to be clinically without evidence of disease. She will continue to follow up with 6 month  evaluations under the care of Dr. Brigitte Pulse, and continue annual mammography. She was given contact information if she has questions or concerns regarding her previous therapy.    In a visit lasting 30 minutes, greater than 50% of the time was spent face to face discussing her previous care, and coordinating future care.     Carola Rhine, PAC

## 2016-07-11 NOTE — Progress Notes (Addendum)
Amanda Li 61 y.o. woman with Breast cancer of lower-inner quadrant of right female breast  radiation completed 05-19-15  FU.  Skin status:Right breast normal color except around the nipple is lighter. Lotion: Using Radiaplex gel to the right breast. Have you seen med onc? If not, when is appointment:04-11-15 Dr. Burr Medico,  seeing her PCP Dr. Marton Redwood last in March 07, 2016 If they are ER+, have they started Al or Tamoxifen? If not, why? Not a candidate for anti-estrigens per Dr. Ernestina Penna note 04-11-15 Discuss survivorship appointment. 07-15-15 Amanda Li, N.P. Have you had a mammogram scheduled? 03-07-16 Offer referral to [facility]. Will receive at the survivorship appointment. 07-15-15 Oncotype Dx. Score: Appetite:Good eating three meals a day. Pain:Having right knee pain 5/10 sometimes in the left knee Fatigue:Yes because of her MS Arm mobility:No difficulty raising her right arm. Lymphedema:None. Wt Readings from Last 3 Encounters:  07/11/16 111 lb 12.8 oz (50.7 kg)  01/18/16 115 lb 1.6 oz (52.2 kg)  06/24/15 117 lb 11.2 oz (53.4 kg)  BP 117/69   Pulse (!) 59   Temp 98.1 F (36.7 C) (Oral)   Resp 16   Ht 5\' 2"  (1.575 m)   Wt 111 lb 12.8 oz (50.7 kg)   SpO2 100%   BMI 20.45 kg/m

## 2016-08-16 DIAGNOSIS — M25561 Pain in right knee: Secondary | ICD-10-CM | POA: Diagnosis not present

## 2016-08-16 DIAGNOSIS — S83281D Other tear of lateral meniscus, current injury, right knee, subsequent encounter: Secondary | ICD-10-CM | POA: Diagnosis not present

## 2016-08-16 DIAGNOSIS — G8929 Other chronic pain: Secondary | ICD-10-CM | POA: Diagnosis not present

## 2016-08-16 DIAGNOSIS — S83241D Other tear of medial meniscus, current injury, right knee, subsequent encounter: Secondary | ICD-10-CM | POA: Diagnosis not present

## 2016-08-24 DIAGNOSIS — R300 Dysuria: Secondary | ICD-10-CM | POA: Diagnosis not present

## 2016-08-24 DIAGNOSIS — N390 Urinary tract infection, site not specified: Secondary | ICD-10-CM | POA: Diagnosis not present

## 2016-09-17 ENCOUNTER — Encounter (HOSPITAL_COMMUNITY): Payer: Self-pay

## 2016-09-17 ENCOUNTER — Ambulatory Visit: Payer: Self-pay | Admitting: Specialist

## 2016-09-17 NOTE — Patient Instructions (Addendum)
Shirly Bartosiewicz  09/17/2016   Your procedure is scheduled on: 09/27/2016   Report to [facility] Main  Entrance Take [place]  elevators to 3rd floor to  [place] at   0800 AM.    Call this number if you have problems the morning of surgery [phone]    Remember: ONLY 1 PERSON MAY GO WITH YOU TO SHORT STAY TO GET  READY MORNING OF YOUR SURGERY.  Do not eat food or drink liquids :After Midnight.     Take these medicines the morning of surgery with A SIP OF WATER: Wellbutrin, Cymbalta, Allegra, Flonase if needed, Synthroid, Toviaz                                  You may not have any metal on your body including hair pins and              piercings  Do not wear jewelry, make-up, lotions, powders or perfumes, deodorant             Do not wear nail polish.  Do not shave  48 hours prior to surgery.                 Do not bring valuables to the hospital. [place].  Contacts, dentures or bridgework may not be worn into surgery.      Patients discharged the day of surgery will not be allowed to drive home.  Name and phone number of your driver:               Please read over the following fact sheets you were given: _____________________________________________________________________             [facility] - Preparing for Surgery Before surgery, you can play an important role.  Because skin is not sterile, your skin needs to be as free of germs as possible.  You can reduce the number of germs on your skin by washing with CHG (chlorahexidine gluconate) soap before surgery.  CHG is an antiseptic cleaner which kills germs and bonds with the skin to continue killing germs even after washing. Please DO NOT use if you have an allergy to CHG or antibacterial soaps.  If your skin becomes reddened/irritated stop using the CHG and inform your nurse when you arrive at Short Stay. Do not shave (including legs and  underarms) for at least 48 hours prior to the first CHG shower.  You may shave your face/neck. Please follow these instructions carefully:  1.  Shower with CHG Soap the night before surgery and the  morning of Surgery.  2.  If you choose to wash your hair, wash your hair first as usual with your  normal  shampoo.  3.  After you shampoo, rinse your hair and body thoroughly to remove the  shampoo.                           4.  Use CHG as you would any other liquid soap.  You can apply chg directly  to the skin and wash  Gently with a scrungie or clean washcloth.  5.  Apply the CHG Soap to your body ONLY FROM THE NECK DOWN.   Do not use on face/ open                           Wound or open sores. Avoid contact with eyes, ears mouth and genitals (private parts).                       Wash face,  Genitals (private parts) with your normal soap.             6.  Wash thoroughly, paying special attention to the area where your surgery  will be performed.  7.  Thoroughly rinse your body with warm water from the neck down.  8.  DO NOT shower/wash with your normal soap after using and rinsing off  the CHG Soap.                9.  Pat yourself dry with a clean towel.            10.  Wear clean pajamas.            11.  Place clean sheets on your bed the night of your first shower and do not  sleep with pets. Day of Surgery : Do not apply any lotions/deodorants the morning of surgery.  Please wear clean clothes to the hospital/surgery center.  FAILURE TO FOLLOW THESE INSTRUCTIONS MAY RESULT IN THE CANCELLATION OF YOUR SURGERY PATIENT SIGNATURE_________________________________  NURSE SIGNATURE__________________________________  ________________________________________________________________________   Adam Phenix  An incentive spirometer is a tool that can help keep your lungs clear and active. This tool measures how well you are filling your lungs with each breath. Taking  long deep breaths may help reverse or decrease the chance of developing breathing (pulmonary) problems (especially infection) following:  A long period of time when you are unable to move or be active. BEFORE THE PROCEDURE   If the spirometer includes an indicator to show your best effort, your nurse or respiratory therapist will set it to a desired goal.  If possible, sit up straight or lean slightly forward. Try not to slouch.  Hold the incentive spirometer in an upright position. INSTRUCTIONS FOR USE  1. Sit on the edge of your bed if possible, or sit up as far as you can in bed or on a chair. 2. Hold the incentive spirometer in an upright position. 3. Breathe out normally. 4. Place the mouthpiece in your mouth and seal your lips tightly around it. 5. Breathe in slowly and as deeply as possible, raising the piston or the ball toward the top of the column. 6. Hold your breath for 3-5 seconds or for as long as possible. Allow the piston or ball to fall to the bottom of the column. 7. Remove the mouthpiece from your mouth and breathe out normally. 8. Rest for a few seconds and repeat Steps 1 through 7 at least 10 times every 1-2 hours when you are awake. Take your time and take a few normal breaths between deep breaths. 9. The spirometer may include an indicator to show your best effort. Use the indicator as a goal to work toward during each repetition. 10. After each set of 10 deep breaths, practice coughing to be sure your lungs are clear. If you have an incision (the cut made at the time of surgery),  support your incision when coughing by placing a pillow or rolled up towels firmly against it. Once you are able to get out of bed, walk around indoors and cough well. You may stop using the incentive spirometer when instructed by your caregiver.  RISKS AND COMPLICATIONS  Take your time so you do not get dizzy or light-headed.  If you are in pain, you may need to take or ask for pain  medication before doing incentive spirometry. It is harder to take a deep breath if you are having pain. AFTER USE  Rest and breathe slowly and easily.  It can be helpful to keep track of a log of your progress. Your caregiver can provide you with a simple table to help with this. If you are using the spirometer at home, follow these instructions: [place] IF:   You are having difficultly using the spirometer.  You have trouble using the spirometer as often as instructed.  Your pain medication is not giving enough relief while using the spirometer.  You develop fever of 100.5 F (38.1 C) or higher. SEEK IMMEDIATE MEDICAL CARE IF:   You cough up bloody sputum that had not been present before.  You develop fever of 102 F (38.9 C) or greater.  You develop worsening pain at or near the incision site. MAKE SURE YOU:   Understand these instructions.  Will watch your condition.  Will get help right away if you are not doing well or get worse. Document Released: 09/17/2006 Document Revised: 07/30/2011 Document Reviewed: 11/18/2006 [facility] Patient Information 2014 [place], [place].   ________________________________________________________________________

## 2016-09-17 NOTE — Progress Notes (Signed)
Please place orders in EPIC as patient has an appointment on 09/18/2016! Thank you!

## 2016-09-18 ENCOUNTER — Encounter (HOSPITAL_COMMUNITY): Payer: Self-pay

## 2016-09-18 ENCOUNTER — Encounter (HOSPITAL_COMMUNITY)
Admission: RE | Admit: 2016-09-18 | Discharge: 2016-09-18 | Disposition: A | Discharge status: Home / Self Care | Payer: 59 | Source: Ambulatory Visit | Attending: Specialist | Admitting: Specialist

## 2016-09-18 DIAGNOSIS — Z01812 Encounter for preprocedural laboratory examination: Secondary | ICD-10-CM | POA: Diagnosis not present

## 2016-09-18 DIAGNOSIS — M25561 Pain in right knee: Secondary | ICD-10-CM | POA: Insufficient documentation

## 2016-09-18 HISTORY — DX: Anxiety disorder, unspecified: F41.9

## 2016-09-18 HISTORY — DX: Malignant (primary) neoplasm, unspecified: C80.1

## 2016-09-18 HISTORY — DX: Other specified postprocedural states: Z98.890

## 2016-09-18 HISTORY — DX: Other specified postprocedural states: R11.2

## 2016-09-18 LAB — CBC
HCT: 38.1 % (ref 36.0–46.0)
Hemoglobin: 12.3 g/dL (ref 12.0–15.0)
MCH: 31.6 pg (ref 26.0–34.0)
MCHC: 32.3 g/dL (ref 30.0–36.0)
MCV: 97.9 fL (ref 78.0–100.0)
PLATELETS: 221 10*3/uL (ref 150–400)
RBC: 3.89 MIL/uL (ref 3.87–5.11)
RDW: 14.3 % (ref 11.5–15.5)
WBC: 4.2 10*3/uL (ref 4.0–10.5)

## 2016-09-27 ENCOUNTER — Ambulatory Visit (HOSPITAL_COMMUNITY): Payer: 59 | Admitting: Certified Registered Nurse Anesthetist

## 2016-09-27 ENCOUNTER — Encounter (HOSPITAL_COMMUNITY)
Admission: RE | Disposition: A | Discharge status: Home / Self Care | Payer: Self-pay | Source: Ambulatory Visit | Attending: Specialist

## 2016-09-27 ENCOUNTER — Encounter (HOSPITAL_COMMUNITY): Payer: Self-pay | Admitting: Certified Registered Nurse Anesthetist

## 2016-09-27 ENCOUNTER — Ambulatory Visit (HOSPITAL_COMMUNITY)
Admission: RE | Admit: 2016-09-27 | Discharge: 2016-09-27 | Disposition: A | Discharge status: Home / Self Care | Payer: 59 | Source: Ambulatory Visit | Attending: Specialist | Admitting: Specialist

## 2016-09-27 DIAGNOSIS — M1711 Unilateral primary osteoarthritis, right knee: Secondary | ICD-10-CM | POA: Diagnosis not present

## 2016-09-27 DIAGNOSIS — S83271A Complex tear of lateral meniscus, current injury, right knee, initial encounter: Secondary | ICD-10-CM | POA: Insufficient documentation

## 2016-09-27 DIAGNOSIS — E039 Hypothyroidism, unspecified: Secondary | ICD-10-CM | POA: Insufficient documentation

## 2016-09-27 DIAGNOSIS — X58XXXA Exposure to other specified factors, initial encounter: Secondary | ICD-10-CM | POA: Insufficient documentation

## 2016-09-27 DIAGNOSIS — M94261 Chondromalacia, right knee: Secondary | ICD-10-CM | POA: Diagnosis not present

## 2016-09-27 DIAGNOSIS — Z880 Allergy status to penicillin: Secondary | ICD-10-CM | POA: Diagnosis not present

## 2016-09-27 DIAGNOSIS — Y999 Unspecified external cause status: Secondary | ICD-10-CM | POA: Diagnosis not present

## 2016-09-27 DIAGNOSIS — Y939 Activity, unspecified: Secondary | ICD-10-CM | POA: Diagnosis not present

## 2016-09-27 DIAGNOSIS — Z79899 Other long term (current) drug therapy: Secondary | ICD-10-CM | POA: Diagnosis not present

## 2016-09-27 DIAGNOSIS — G35 Multiple sclerosis: Secondary | ICD-10-CM | POA: Diagnosis not present

## 2016-09-27 DIAGNOSIS — F329 Major depressive disorder, single episode, unspecified: Secondary | ICD-10-CM | POA: Diagnosis not present

## 2016-09-27 DIAGNOSIS — Z7982 Long term (current) use of aspirin: Secondary | ICD-10-CM | POA: Diagnosis not present

## 2016-09-27 DIAGNOSIS — Y929 Unspecified place or not applicable: Secondary | ICD-10-CM | POA: Insufficient documentation

## 2016-09-27 DIAGNOSIS — F419 Anxiety disorder, unspecified: Secondary | ICD-10-CM | POA: Diagnosis not present

## 2016-09-27 DIAGNOSIS — M2241 Chondromalacia patellae, right knee: Secondary | ICD-10-CM | POA: Insufficient documentation

## 2016-09-27 DIAGNOSIS — M23351 Other meniscus derangements, posterior horn of lateral meniscus, right knee: Secondary | ICD-10-CM | POA: Diagnosis not present

## 2016-09-27 DIAGNOSIS — G8918 Other acute postprocedural pain: Secondary | ICD-10-CM | POA: Diagnosis not present

## 2016-09-27 DIAGNOSIS — Z853 Personal history of malignant neoplasm of breast: Secondary | ICD-10-CM | POA: Diagnosis not present

## 2016-09-27 HISTORY — PX: KNEE ARTHROSCOPY WITH LATERAL MENISECTOMY: SHX6193

## 2016-09-27 SURGERY — ARTHROSCOPY, KNEE, WITH LATERAL MENISCECTOMY
Anesthesia: General | Site: Knee | Laterality: Right

## 2016-09-27 MED ORDER — MIDAZOLAM HCL 2 MG/2ML IJ SOLN
INTRAMUSCULAR | Status: AC
  Administered 2016-09-27: 1 mg
  Filled 2016-09-27: qty 2

## 2016-09-27 MED ORDER — OXYCODONE HCL 5 MG/5ML PO SOLN
5.0000 mg | Freq: Once | ORAL | Status: DC | PRN
  Filled 2016-09-27: qty 5

## 2016-09-27 MED ORDER — DEXAMETHASONE SODIUM PHOSPHATE 4 MG/ML IJ SOLN
INTRAMUSCULAR | Status: DC | PRN
  Administered 2016-09-27: 10 mg via INTRAVENOUS

## 2016-09-27 MED ORDER — EPINEPHRINE PF 1 MG/ML IJ SOLN
INTRAMUSCULAR | Status: AC
  Filled 2016-09-27: qty 2

## 2016-09-27 MED ORDER — LACTATED RINGERS IV SOLN
INTRAVENOUS | Status: DC
  Administered 2016-09-27: 09:00:00 via INTRAVENOUS

## 2016-09-27 MED ORDER — FENTANYL CITRATE (PF) 100 MCG/2ML IJ SOLN
INTRAMUSCULAR | Status: AC
  Filled 2016-09-27: qty 2

## 2016-09-27 MED ORDER — LACTATED RINGERS IR SOLN
Status: DC | PRN
Start: 2016-09-27 — End: 2016-09-27
  Administered 2016-09-27: 6000 mL

## 2016-09-27 MED ORDER — MIDAZOLAM HCL 2 MG/2ML IJ SOLN
INTRAMUSCULAR | Status: AC
  Filled 2016-09-27: qty 2

## 2016-09-27 MED ORDER — CEFAZOLIN SODIUM-DEXTROSE 2-4 GM/100ML-% IV SOLN
2.0000 g | INTRAVENOUS | Status: AC
  Administered 2016-09-27: 2 g via INTRAVENOUS
  Filled 2016-09-27: qty 100

## 2016-09-27 MED ORDER — LIDOCAINE 2% (20 MG/ML) 5 ML SYRINGE
INTRAMUSCULAR | Status: AC
  Filled 2016-09-27: qty 5

## 2016-09-27 MED ORDER — ONDANSETRON HCL 4 MG/2ML IJ SOLN
INTRAMUSCULAR | Status: DC | PRN
  Administered 2016-09-27 (×2): 4 mg via INTRAVENOUS

## 2016-09-27 MED ORDER — HYDROMORPHONE HCL 1 MG/ML IJ SOLN: 0.2500 mg | INTRAMUSCULAR | Status: DC | PRN

## 2016-09-27 MED ORDER — LIDOCAINE HCL (CARDIAC) 20 MG/ML IV SOLN
INTRAVENOUS | Status: DC | PRN
  Administered 2016-09-27: 100 mg via INTRAVENOUS

## 2016-09-27 MED ORDER — ONDANSETRON HCL 4 MG/2ML IJ SOLN: 4.0000 mg | Freq: Once | INTRAMUSCULAR | Status: DC | PRN

## 2016-09-27 MED ORDER — ONDANSETRON HCL 4 MG/2ML IJ SOLN
INTRAMUSCULAR | Status: AC
  Filled 2016-09-27: qty 4

## 2016-09-27 MED ORDER — OXYCODONE-ACETAMINOPHEN 5-325 MG PO TABS: 1.0000 | ORAL_TABLET | ORAL | 0 refills | Status: DC | PRN

## 2016-09-27 MED ORDER — OXYCODONE HCL 5 MG PO TABS: 5.0000 mg | ORAL_TABLET | Freq: Once | ORAL | Status: DC | PRN

## 2016-09-27 MED ORDER — FENTANYL CITRATE (PF) 100 MCG/2ML IJ SOLN
INTRAMUSCULAR | Status: AC
  Administered 2016-09-27: 50 ug
  Filled 2016-09-27: qty 2

## 2016-09-27 MED ORDER — CHLORHEXIDINE GLUCONATE 4 % EX LIQD: 60.0000 mL | Freq: Once | CUTANEOUS | Status: DC

## 2016-09-27 MED ORDER — BUPIVACAINE HCL (PF) 0.5 % IJ SOLN
INTRAMUSCULAR | Status: DC | PRN
  Administered 2016-09-27: 10 mL

## 2016-09-27 MED ORDER — MIDAZOLAM HCL 5 MG/ML IJ SOLN
2.0000 mg | Freq: Once | INTRAMUSCULAR | Status: AC
  Administered 2016-09-27: 1 mg via INTRAVENOUS

## 2016-09-27 MED ORDER — LIDOCAINE-EPINEPHRINE (PF) 1 %-1:200000 IJ SOLN
INTRAMUSCULAR | Status: AC
  Filled 2016-09-27: qty 30

## 2016-09-27 MED ORDER — PROPOFOL 10 MG/ML IV BOLUS
INTRAVENOUS | Status: AC
  Filled 2016-09-27: qty 20

## 2016-09-27 MED ORDER — HYDROMORPHONE HCL 2 MG/ML IJ SOLN
INTRAMUSCULAR | Status: AC
  Filled 2016-09-27: qty 1

## 2016-09-27 MED ORDER — FENTANYL CITRATE (PF) 100 MCG/2ML IJ SOLN
100.0000 ug | Freq: Once | INTRAMUSCULAR | Status: AC
  Administered 2016-09-27: 50 ug via INTRAVENOUS

## 2016-09-27 MED ORDER — BUPIVACAINE HCL (PF) 0.5 % IJ SOLN
INTRAMUSCULAR | Status: AC
  Filled 2016-09-27: qty 30

## 2016-09-27 MED ORDER — PROPOFOL 10 MG/ML IV BOLUS
INTRAVENOUS | Status: DC | PRN
  Administered 2016-09-27: 130 mg via INTRAVENOUS

## 2016-09-27 MED ORDER — DEXAMETHASONE SODIUM PHOSPHATE 10 MG/ML IJ SOLN
INTRAMUSCULAR | Status: AC
  Filled 2016-09-27: qty 1

## 2016-09-27 MED ORDER — EPINEPHRINE PF 1 MG/ML IJ SOLN
INTRAMUSCULAR | Status: DC | PRN
  Administered 2016-09-27: 2 mL

## 2016-09-27 SURGICAL SUPPLY — 29 items
BANDAGE ACE 6X5 VEL STRL LF (GAUZE/BANDAGES/DRESSINGS) ×3 IMPLANT
BLADE CUDA SHAVER 3.5 (BLADE) ×3 IMPLANT
BLADE SURG SZ11 CARB STEEL (BLADE) IMPLANT
BOOTIES KNEE HIGH SLOAN (MISCELLANEOUS) ×6 IMPLANT
CLOTH 2% CHLOROHEXIDINE 3PK (PERSONAL CARE ITEMS) ×3 IMPLANT
COVER SURGICAL LIGHT HANDLE (MISCELLANEOUS) ×3 IMPLANT
DRSG EMULSION OIL 3X3 NADH (GAUZE/BANDAGES/DRESSINGS) ×3 IMPLANT
DRSG PAD ABDOMINAL 8X10 ST (GAUZE/BANDAGES/DRESSINGS) ×3 IMPLANT
DURAPREP 26ML APPLICATOR (WOUND CARE) ×3 IMPLANT
GAUZE SPONGE 4X4 12PLY STRL (GAUZE/BANDAGES/DRESSINGS) ×3 IMPLANT
GLOVE BIOGEL PI IND STRL 7.0 (GLOVE) ×1 IMPLANT
GLOVE BIOGEL PI IND STRL 8 (GLOVE) ×1 IMPLANT
GLOVE BIOGEL PI INDICATOR 7.0 (GLOVE) ×2
GLOVE BIOGEL PI INDICATOR 8 (GLOVE) ×2
GLOVE SURG SS PI 7.0 STRL IVOR (GLOVE) ×3 IMPLANT
GLOVE SURG SS PI 7.5 STRL IVOR (GLOVE) ×3 IMPLANT
GLOVE SURG SS PI 8.0 STRL IVOR (GLOVE) ×6 IMPLANT
GOWN STRL REUS W/TWL LRG LVL3 (GOWN DISPOSABLE) ×3 IMPLANT
GOWN STRL REUS W/TWL XL LVL3 (GOWN DISPOSABLE) ×6 IMPLANT
KIT BASIN OR (CUSTOM PROCEDURE TRAY) IMPLANT
MANIFOLD NEPTUNE II (INSTRUMENTS) ×6 IMPLANT
PACK ARTHROSCOPY WL (CUSTOM PROCEDURE TRAY) ×3 IMPLANT
PADDING CAST COTTON 6X4 STRL (CAST SUPPLIES) ×3 IMPLANT
PROBE BIPOLAR 50 DEGREE SUCT (MISCELLANEOUS) ×3 IMPLANT
SUT ETHILON 4 0 PS 2 18 (SUTURE) ×3 IMPLANT
TOWEL OR 17X26 10 PK STRL BLUE (TOWEL DISPOSABLE) ×3 IMPLANT
TUBING ARTHRO INFLOW-ONLY STRL (TUBING) ×3 IMPLANT
WAND HAND CNTRL MULTIVAC 50 (MISCELLANEOUS) ×3 IMPLANT
WRAP KNEE MAXI GEL POST OP (GAUZE/BANDAGES/DRESSINGS) ×3 IMPLANT

## 2016-09-27 NOTE — Transfer of Care (Signed)
Immediate Anesthesia Transfer of Care Note  Patient: Amanda Li  Procedure(s) Performed: Procedure(s) with comments: Right knee arthroscopy, partial lateral menisectomy, debridement (Right) - requests 60 mins  Patient Location: PACU  Anesthesia Type:General  Level of Consciousness:  sedated, patient cooperative and responds to stimulation  Airway & Oxygen Therapy:Patient Spontanous Breathing and Patient connected to face mask oxgen  Post-op Assessment:  Report given to PACU RN and Post -op Vital signs reviewed and stable  Post vital signs:  Reviewed and stable  Last Vitals:  Vitals:   09/27/16 1054 09/27/16 1055  BP:  133/80  Pulse: 83 82  Resp: 14 14  Temp:      Complications: No apparent anesthesia complications

## 2016-09-27 NOTE — Anesthesia Preprocedure Evaluation (Signed)
Anesthesia Evaluation  Patient identified by MRN, date of birth, ID band Patient awake    Reviewed: Allergy & Precautions, NPO status , Patient's Chart, lab work & pertinent test results  Airway Mallampati: II  TM Distance: >3 FB Neck ROM: Full    Dental  (+) Teeth Intact, Dental Advisory Given   Pulmonary    breath sounds clear to auscultation       Cardiovascular  Rhythm:Regular Rate:Normal     Neuro/Psych    GI/Hepatic   Endo/Other    Renal/GU      Musculoskeletal   Abdominal   Peds  Hematology   Anesthesia Other Findings   Reproductive/Obstetrics                             Anesthesia Physical Anesthesia Plan  ASA: III  Anesthesia Plan: General   Post-op Pain Management:    Induction: Intravenous  Airway Management Planned: LMA  Additional Equipment:   Intra-op Plan:   Post-operative Plan:   Informed Consent: I have reviewed the patients History and Physical, chart, labs and discussed the procedure including the risks, benefits and alternatives for the proposed anesthesia with the patient or authorized representative who has indicated his/her understanding and acceptance.   Dental advisory given  Plan Discussed with: CRNA and Anesthesiologist  Anesthesia Plan Comments:         Anesthesia Quick Evaluation

## 2016-09-27 NOTE — Discharge Instructions (Signed)

## 2016-09-27 NOTE — H&P (Signed)
Amanda Li is an 61 y.o. female.   Chief Complaint: Right knee pain  HPI: Lateral meniscus tear.  Past Medical History:  Diagnosis Date  . Allergy    seasonal  . Anxiety   . Cancer [facility])    right breast cancer   . Dental crowns present   . Depression   . Hypothyroidism   . Joint pain 02/2015   legs  . Multiple sclerosis ([place])   . PONV (postoperative nausea and vomiting)   . Thyroid disease    hypothyroid  . Urinary frequency     Past Surgical History:  Procedure Laterality Date  . BREAST LUMPECTOMY WITH RADIOACTIVE SEED LOCALIZATION Right 03/25/2015   Procedure: BREAST LUMPECTOMY WITH RADIOACTIVE SEED LOCALIZATION;  Surgeon: Autumn Messing III, MD;  Location: [place];  Service: General;  Laterality: Right;  . CESAREAN SECTION     x 2  . COLONOSCOPY WITH PROPOFOL  03/25/2014  . LAPAROSCOPIC ABDOMINAL EXPLORATION      Family History  Problem Relation Age of Onset  . Heart disease Mother   . Lung cancer Father    Social History:  reports that she has never smoked. She has never used smokeless tobacco. She reports that she drinks about 4.2 oz of alcohol per week . She reports that she does not use drugs.  Allergies:  Allergies  Allergen Reactions  . Fentanyl Nausea And Vomiting  . Penicillins Swelling    FEET Has patient had a PCN reaction causing immediate rash, facial/tongue/throat swelling, SOB or lightheadedness with hypotension: No Has patient had a PCN reaction causing severe rash involving mucus membranes or skin necrosis: No Has patient had a PCN reaction that required hospitalization No Has patient had a PCN reaction occurring within the last 10 years: No If all of the above answers are "NO", then may proceed with Cephalosporin use.     Medications Prior to Admission  Medication Sig Dispense Refill  . acetaminophen (TYLENOL) 500 MG tablet Take 500 mg by mouth daily as needed for moderate pain.     Marland Kitchen aspirin EC 81 MG tablet Take 81 mg by  mouth daily.    . baclofen (LIORESAL) 20 MG tablet Take 20 mg by mouth 3 (three) times daily.     Marland Kitchen buPROPion (WELLBUTRIN XL) 150 MG 24 hr tablet Take 150 mg by mouth daily.   6  . Cholecalciferol (PA VITAMIN D-3) 2000 UNITS CAPS Take 4,000 Units by mouth daily.     . diazepam (VALIUM) 5 MG tablet takes 2.5 mg (0.5 tablet) at bedtime as needed for restless leg syndrome  0  . DULoxetine (CYMBALTA) 60 MG capsule Take 60 mg by mouth daily.     . fexofenadine (ALLEGRA) 180 MG tablet Take 180 mg by mouth daily.    . fluticasone (FLONASE) 50 MCG/ACT nasal spray 1  spray in each nostril daily as needed for allergies  11  . levothyroxine (SYNTHROID, LEVOTHROID) 100 MCG tablet Take 100 mcg by mouth daily.     . naproxen sodium (ANAPROX) 220 MG tablet Take 220 mg by mouth 2 (two) times daily with a meal.     . TOVIAZ 4 MG TB24 tablet Take 4 mg by mouth daily.      No results found for this or any previous visit (from the past 48 hour(s)). No results found.  Review of Systems  Musculoskeletal: Positive for joint pain.  All other systems reviewed and are negative.   Blood pressure (!) 143/79, pulse Marland Kitchen)  56, temperature 97.9 F (36.6 C), temperature source Oral, resp. rate 17, height 5\' 2"  (1.575 m), weight 50.4 kg (111 lb 2 oz), SpO2 99 %. Physical Exam  Vitals reviewed. Constitutional: She is oriented to person, place, and time. She appears well-developed.  HENT:  Head: Normocephalic.  Eyes: Pupils are equal, round, and reactive to light.  Neck: Normal range of motion.  Cardiovascular: Normal rate.   Respiratory: Effort normal.  GI: Soft.  Musculoskeletal: She exhibits tenderness.  Lateral and patellofemoral pain. Positive [place]. No DVT. NVI  Neurological: She is alert and oriented to person, place, and time.  Skin: Skin is warm and dry.  Psychiatric: She has a normal mood and affect.     Assessment/Plan Right knee lateral meniscus tear. Plan knee arthroscopy and partial lateral  menisectomy and debr. Risks discussed.   Johnn Hai, MD 09/27/2016, 10:37 AM

## 2016-09-27 NOTE — Anesthesia Procedure Notes (Signed)
Procedure Name: LMA Insertion Date/Time: 09/27/2016 11:07 AM Performed by: Claudia Desanctis Pre-anesthesia Checklist: Emergency Drugs available, Patient identified, Suction available and Patient being monitored Patient Re-evaluated:Patient Re-evaluated prior to inductionOxygen Delivery Method: Circle system utilized Preoxygenation: Pre-oxygenation with 100% oxygen Intubation Type: IV induction Ventilation: Mask ventilation without difficulty LMA: LMA inserted and LMA with gastric port inserted LMA Size: 3.0 Number of attempts: 1 Placement Confirmation: positive ETCO2 and breath sounds checked- equal and bilateral Tube secured with: Tape Dental Injury: Teeth and Oropharynx as per pre-operative assessment

## 2016-09-27 NOTE — Anesthesia Postprocedure Evaluation (Addendum)
Anesthesia Post Note  Patient: Amanda Li  Procedure(s) Performed: Procedure(s) (LRB): Right knee arthroscopy, partial lateral menisectomy, debridement (Right)  Patient location during evaluation: PACU Anesthesia Type: General and Regional Level of consciousness: awake, awake and alert and oriented Pain management: pain level controlled Vital Signs Assessment: post-procedure vital signs reviewed and stable Respiratory status: nonlabored ventilation and respiratory function stable Cardiovascular status: blood pressure returned to baseline Anesthetic complications: no       Last Vitals:  Vitals:   09/27/16 1302 09/27/16 1338  BP: (!) 145/85 (!) 142/86  Pulse: 63 (!) 59  Resp: 16 16  Temp: 36.9 C     Last Pain:  Vitals:   09/27/16 1338  TempSrc:   PainSc: 0-No pain                 Dannette Kinkaid COKER

## 2016-09-27 NOTE — Progress Notes (Signed)
AssistedDr. Joslin with right, ultrasound guided, adductor canal block. Side rails up, monitors on throughout procedure. See vital signs in flow sheet. Tolerated Procedure well.  

## 2016-09-27 NOTE — Anesthesia Procedure Notes (Signed)
Anesthesia Regional Block: Adductor canal block   Pre-Anesthetic Checklist: ,, timeout performed, Correct Patient, Correct Site, Correct Laterality, Correct Procedure, Correct Position, site marked, Risks and benefits discussed,  Surgical consent,  Pre-op evaluation,  At surgeon's request and post-op pain management  Laterality: Right  Prep: chloraprep       Needles:  Injection technique: Single-shot  Needle Type: Echogenic Needle     Needle Length: 9cm  Needle Gauge: 21     Additional Needles:   Procedures: ultrasound guided,,,,,,,,  Narrative:  Start time: 09/27/2016 11:01 AM End time: 09/27/2016 11:05 AM Injection made incrementally with aspirations every 5 mL.  Performed by: Personally

## 2016-09-27 NOTE — Brief Op Note (Signed)
09/27/2016  11:46 AM  PATIENT:  Amanda Li  61 y.o. female  PRE-OPERATIVE DIAGNOSIS:  Right knee lateral meniscal tear, degenerative joint disease  POST-OPERATIVE DIAGNOSIS:  Right knee lateral meniscal tear, degenerative joint disease  PROCEDURE:  Procedure(s) with comments: Right knee arthroscopy, partial lateral menisectomy, debridement (Right) - requests 60 mins  SURGEON:  Surgeon(s) and Role:    Susa Day, MD - Primary  PHYSICIAN ASSISTANT:   ASSISTANTSMancel Bale   ANESTHESIA:   general  EBL:  No intake/output data recorded.  BLOOD ADMINISTERED:none  DRAINS: none   LOCAL MEDICATIONS USED:  MARCAINE     SPECIMEN:  No Specimen  DISPOSITION OF SPECIMEN:  N/A  COUNTS:  YES  TOURNIQUET:  * No tourniquets in log *  DICTATION: .Other Dictation: Dictation Number [id number]  PLAN OF CARE: Discharge to home after PACU  PATIENT DISPOSITION:  PACU - hemodynamically stable.   Delay start of Pharmacological VTE agent (>24hrs) due to surgical blood loss or risk of bleeding: no

## 2016-09-28 ENCOUNTER — Encounter (HOSPITAL_COMMUNITY): Payer: Self-pay | Admitting: Specialist

## 2016-09-28 NOTE — Op Note (Signed)
NAME:  Amanda Li, Amanda Li NO.:  MEDICAL RECORD NO.:  [id number]  LOCATION:                                 FACILITY:  PHYSICIAN:  Susa Day, M.D.         DATE OF BIRTH:  DATE OF PROCEDURE:  09/27/2016 DATE OF DISCHARGE:                              OPERATIVE REPORT   PREOPERATIVE DIAGNOSIS:  Degenerative joint disease, lateral meniscus tear, right knee.  POSTOPERATIVE DIAGNOSIS:  Grade 3 chondromalacia of patella, lateral tibial plateau, lateral femoral condyle, medial tibial plateau, lateral meniscus tear.  PROCEDURES PERFORMED: 1. Right knee arthroscopy. 2. Partial lateral meniscectomy, complex. 3. Chondroplasty of patellofemoral joint, medial femoral condyle,     medial tibial plateau, lateral tibial plateau, lateral femoral     condyle.  ANESTHESIA:  General.  ASSISTANT:  Nehemiah Massed, PA, was used to figure for the position and hold the leg to gain access to the lateral compartment, which is difficult and unable to do so without an assistance.  INDICATIONS:  A 61, locking and giving way, knee pain with lateral meniscus tear and chondromalacia, indicated for knee arthroscopy, partial lateral meniscectomy.  Risks and benefits were discussed including bleeding, infection, damage to the neurovascular structures, no change in symptoms, worsening symptoms, DVT, PE, anesthetic complications, etc.  TECHNIQUE:  With the patient in supine position, after induction of adequate general anesthesia, 1 g of Kefzol, right lower extremity was prepped and draped in usual sterile fashion.  A lateral parapatellar portal was fashioned with a #11 blade.  Ingress cannula atraumatically placed.  Irrigant was utilized to insufflate the joint.  Under direct visualization, a medial parapatellar port was fashioned with a #11 blade after localization with an 18-gauge needle sparing the medial meniscus. Copious clear synovial fluid was evacuated with cartilaginous  debris. Grade 3 changes of the tibial plateau, light chondroplasty performed here.  Meniscus stable to probe palpation without evidence of tear. Light chondroplasty performed on femoral condyle.  No grade 4 changes.  ACL was unremarkable.  Lateral compartment revealed extensive grade 3 changes, tibial plateau, lateral femoral condyle, and no grade 4 changes.  Light chondroplasty was performed of the femoral condyle, tibial plateau, meniscus tear, complex, posterior third.  It was almost like a block divot of the lateral meniscus extending to the outer third of the meniscus, introduced a basket and beveled the lateral and medial portion of this, further contouring it with 3.5 shaver and the basket, then an ArthroWand to contour and bevel that meniscus.  The remnant was stable to probe palpation in flexion, extension.  Patellofemoral joint, grade 3 changes, light chondroplasty performed of the sulcus and patella, normal patellofemoral tracking.  Gutters were unremarkable.  Revisited all compartments.  No further pathology, minimal arthroscopic intervention.  Therefore, removed all instrumentation.  Portals were closed with 4-0 nylon in simple sutures, 0.25% Marcaine with epinephrine was infiltrated in the joint.  Wound was dressed sterilely, awoken without difficulty and transported to the recovery room in satisfactory condition.  The patient tolerated the procedure well.  No complications.  Assistant, Nehemiah Massed, PA again was required.  Minimal blood loss.     Susa Day,  M.D.     Geralynn Rile  D:  09/27/2016  T:  09/27/2016  Job:  [id number]

## 2016-10-18 DIAGNOSIS — M25561 Pain in right knee: Secondary | ICD-10-CM | POA: Diagnosis not present

## 2016-10-22 DIAGNOSIS — M25561 Pain in right knee: Secondary | ICD-10-CM | POA: Diagnosis not present

## 2016-10-23 DIAGNOSIS — G35 Multiple sclerosis: Secondary | ICD-10-CM | POA: Diagnosis not present

## 2016-10-26 DIAGNOSIS — M25561 Pain in right knee: Secondary | ICD-10-CM | POA: Diagnosis not present

## 2016-10-30 DIAGNOSIS — M25561 Pain in right knee: Secondary | ICD-10-CM | POA: Diagnosis not present

## 2016-11-01 DIAGNOSIS — M25561 Pain in right knee: Secondary | ICD-10-CM | POA: Diagnosis not present

## 2016-11-06 DIAGNOSIS — M25561 Pain in right knee: Secondary | ICD-10-CM | POA: Diagnosis not present

## 2016-11-07 DIAGNOSIS — G35 Multiple sclerosis: Secondary | ICD-10-CM | POA: Diagnosis not present

## 2016-11-08 IMAGING — MG MM DIGITAL DIAGNOSTIC BILAT W/ TOMO W/ CAD
11 of 15 series · 11 of 31 positions shown · non-contrast
Comparison: 02/17/2014.

CLINICAL DATA: Patient states that she has noted right breast
nipple. The patient states that she has noticed right breast nipple
discharge for approximately 1 year. This may be spontaneous at times
and is also with expression. She has no left breast nipple
discharge.

EXAM:
DIGITAL DIAGNOSTIC BILATERAL MAMMOGRAM WITH 3D TOMOSYNTHESIS WITH
CAD
ULTRASOUND BILATERAL BREAST

[R ML]
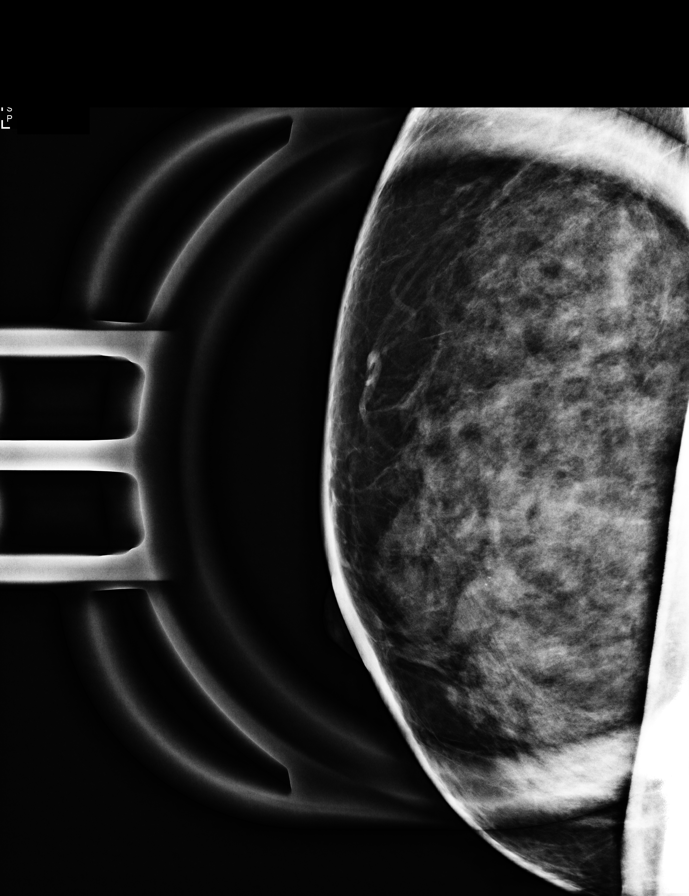

[R CC (1 of 3)]
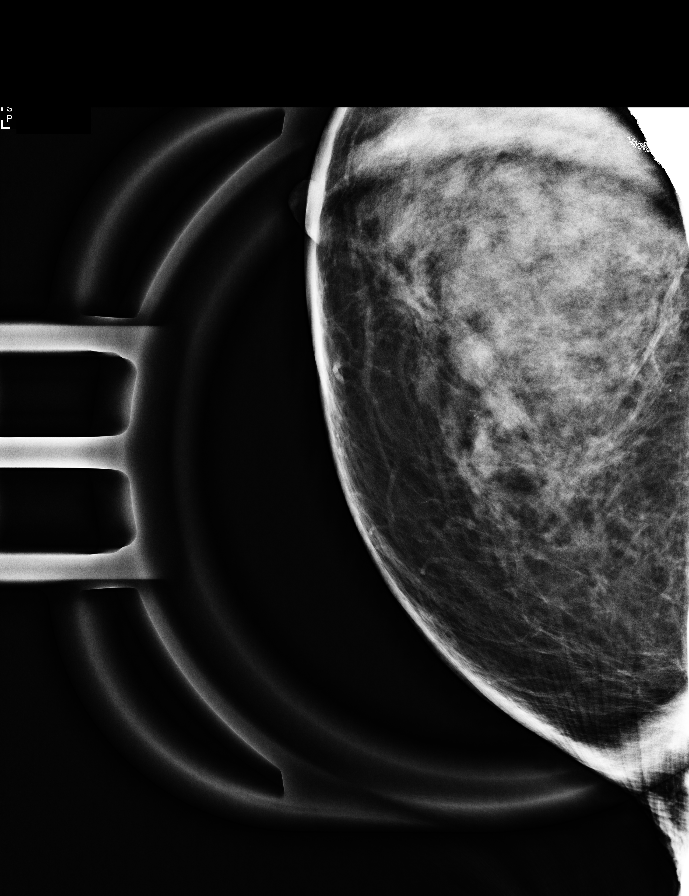

[R CC (2 of 3)]
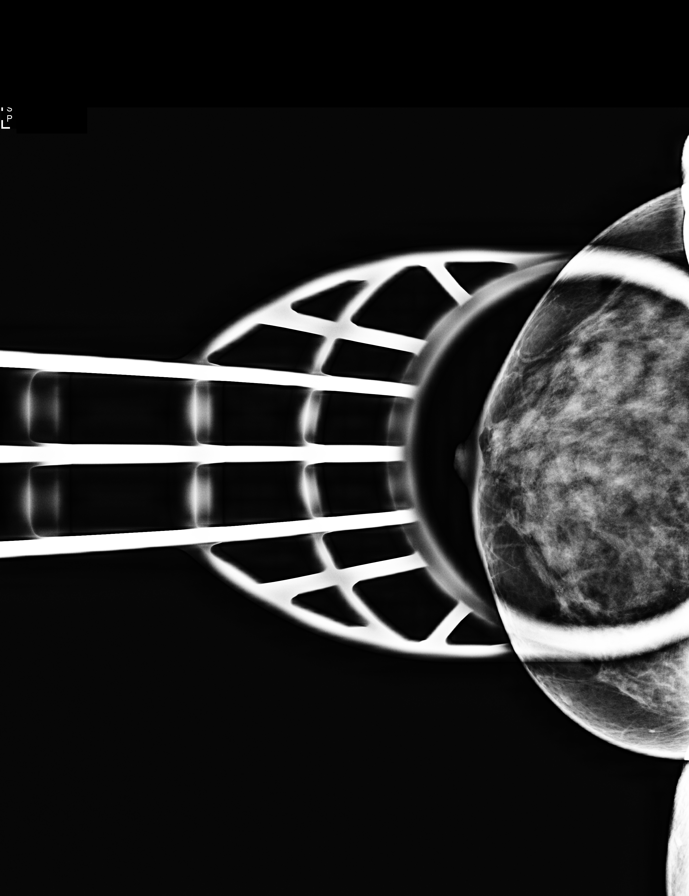

[L CC]
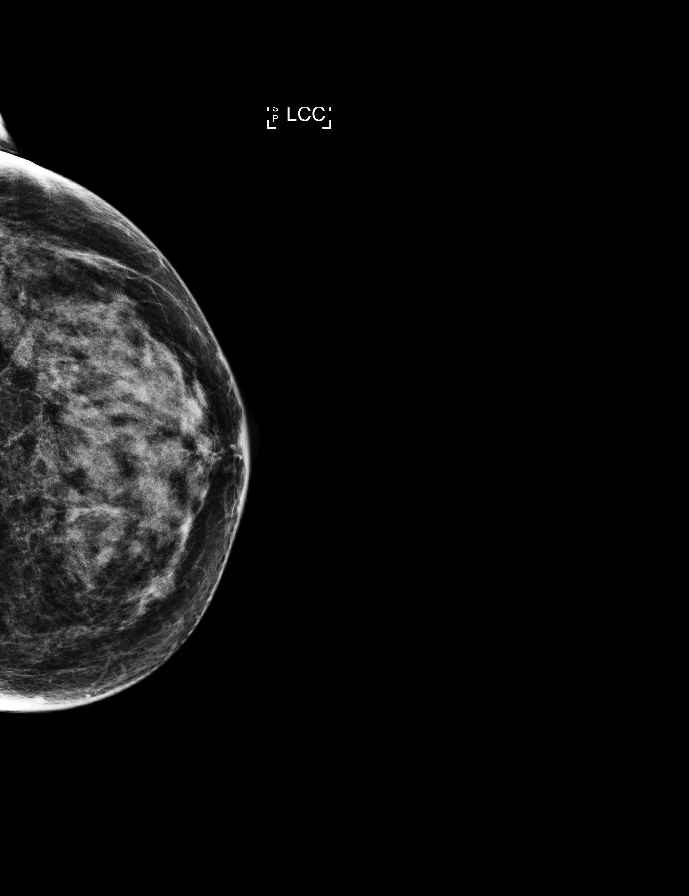

[R MLO]
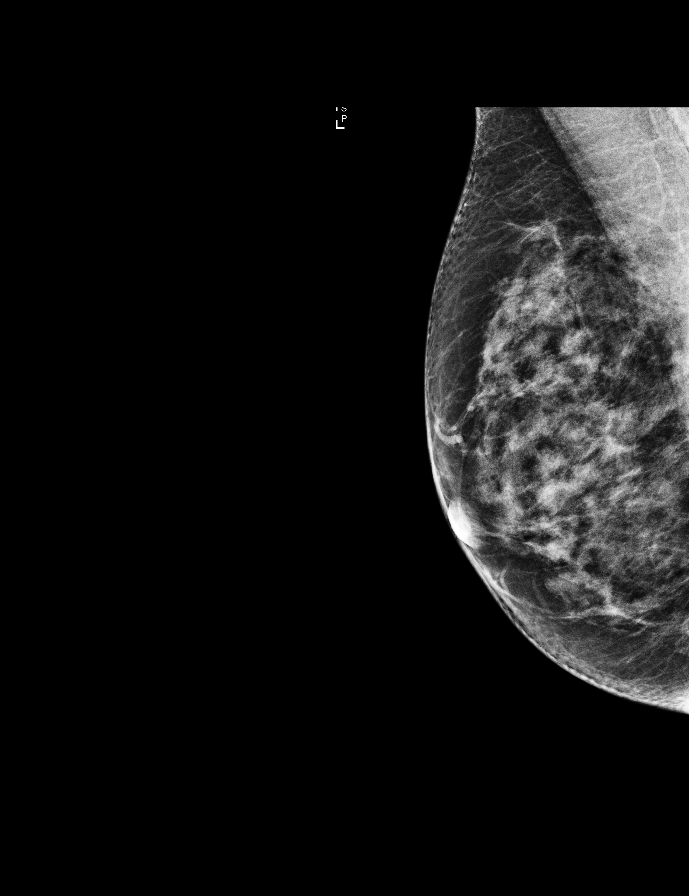

[L MLO]
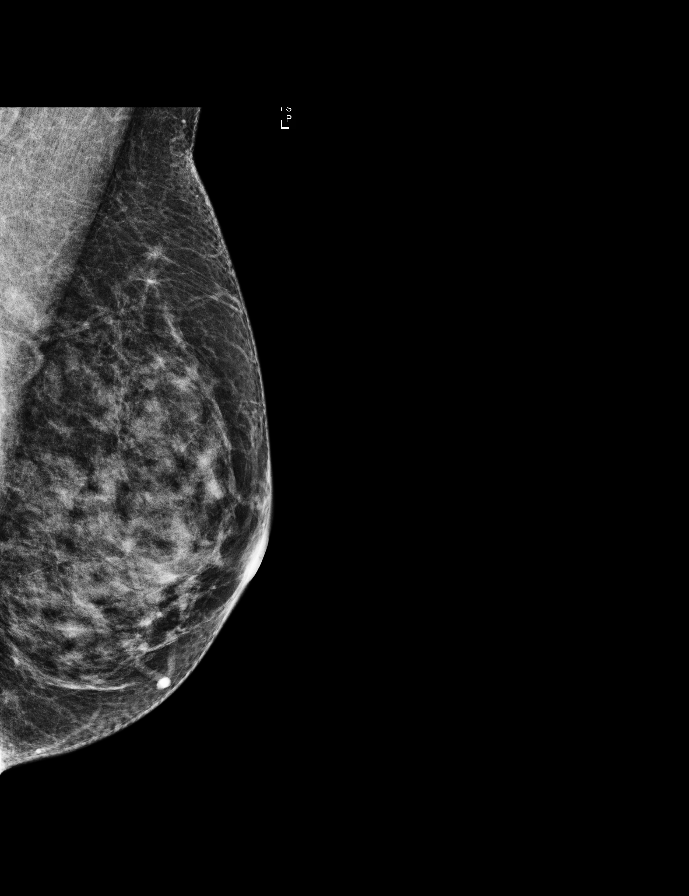

[R CC (3 of 3)]
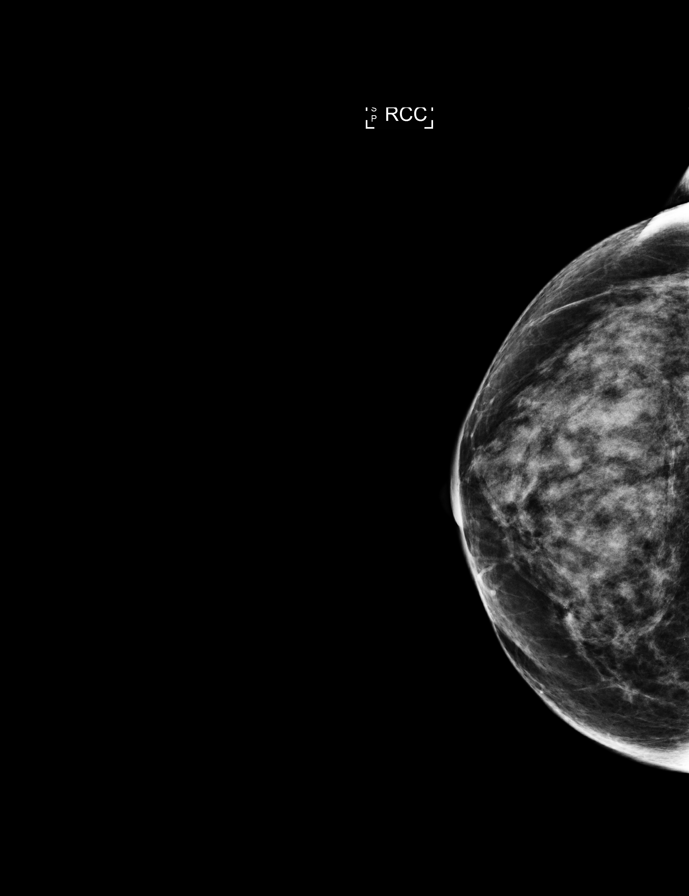

[L MLO tomo]
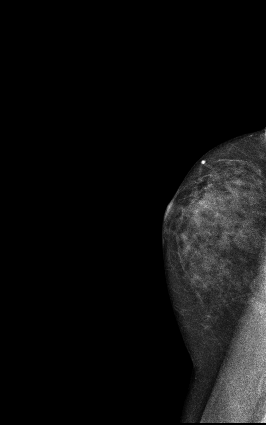

[L CC tomo]
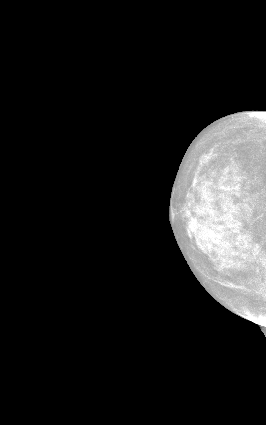

[R CC tomo]
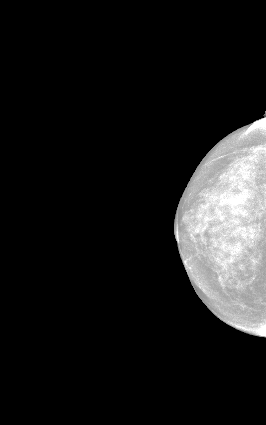

[R MLO tomo]
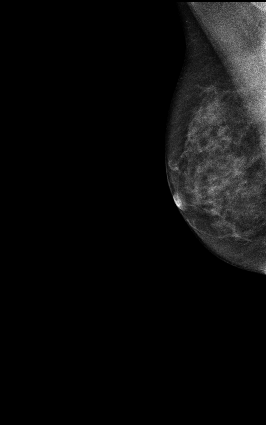

[11 of 31 positions shown; findings below may reference images not displayed]

ACR Breast Density Category c: The breast tissue is heterogeneously
dense, which may obscure small masses.
FINDINGS: There is a small group of heterogeneous calcifications located
within the lower inner quadrant of the right breast at approximately
the 3;30 o'clock location. These span 3 mm. There is no evidence for
a subareolar right breast mass. There is a multinodular breast
parenchymal pattern present bilaterally. There is no distortion.

Mammographic images were processed with CAD.

On physical exam, single duct, amber, nipple discharge could be
expressed from a duct within the central portion of the right
nipple. There was no palpable subareolar breast mass bilaterally.

Targeted ultrasound is performed, showing no evidence for an
intraductal mass or subareolar breast mass within the right
subareolar region or within the left subareolar region.
IMPRESSION: 1. For 3 mm group of slightly suspicious calcifications located
within the lower inner quadrant of the right breast. Stereotactic
biopsy of these calcifications is recommended and will be scheduled.
2. Spontaneous single duct right breast nipple discharge. Right
breast ductography is recommended.

RECOMMENDATION:
1. Right breast ductography.
2. Right breast stereotactic biopsy as discussed above.

I have discussed the findings and recommendations with the patient.
Results were also provided in writing at the conclusion of the
visit. If applicable, a reminder letter will be sent to the patient
regarding the next appointment.

BI-RADS CATEGORY  4: Suspicious.

## 2016-11-13 DIAGNOSIS — M25561 Pain in right knee: Secondary | ICD-10-CM | POA: Diagnosis not present

## 2016-11-15 DIAGNOSIS — M25561 Pain in right knee: Secondary | ICD-10-CM | POA: Diagnosis not present

## 2016-11-20 DIAGNOSIS — M25561 Pain in right knee: Secondary | ICD-10-CM | POA: Diagnosis not present

## 2016-11-22 DIAGNOSIS — M25561 Pain in right knee: Secondary | ICD-10-CM | POA: Diagnosis not present

## 2016-12-27 DIAGNOSIS — M2352 Chronic instability of knee, left knee: Secondary | ICD-10-CM | POA: Diagnosis not present

## 2017-01-10 NOTE — Addendum Note (Signed)
Addendum  created 01/10/17 1424 by Roberts Gaudy, MD   Sign clinical note

## 2017-01-16 DIAGNOSIS — R300 Dysuria: Secondary | ICD-10-CM | POA: Diagnosis not present

## 2017-01-16 DIAGNOSIS — N390 Urinary tract infection, site not specified: Secondary | ICD-10-CM | POA: Diagnosis not present

## 2017-02-05 ENCOUNTER — Other Ambulatory Visit: Payer: Self-pay | Admitting: Internal Medicine

## 2017-02-05 DIAGNOSIS — Z853 Personal history of malignant neoplasm of breast: Secondary | ICD-10-CM

## 2017-02-08 DIAGNOSIS — Z0000 Encounter for general adult medical examination without abnormal findings: Secondary | ICD-10-CM | POA: Diagnosis not present

## 2017-02-08 DIAGNOSIS — E039 Hypothyroidism, unspecified: Secondary | ICD-10-CM | POA: Diagnosis not present

## 2017-02-15 DIAGNOSIS — E038 Other specified hypothyroidism: Secondary | ICD-10-CM | POA: Diagnosis not present

## 2017-02-15 DIAGNOSIS — G250 Essential tremor: Secondary | ICD-10-CM | POA: Diagnosis not present

## 2017-02-15 DIAGNOSIS — Z0000 Encounter for general adult medical examination without abnormal findings: Secondary | ICD-10-CM | POA: Diagnosis not present

## 2017-02-15 DIAGNOSIS — Z23 Encounter for immunization: Secondary | ICD-10-CM | POA: Diagnosis not present

## 2017-02-15 DIAGNOSIS — G35 Multiple sclerosis: Secondary | ICD-10-CM | POA: Diagnosis not present

## 2017-02-15 DIAGNOSIS — N390 Urinary tract infection, site not specified: Secondary | ICD-10-CM | POA: Diagnosis not present

## 2017-03-11 ENCOUNTER — Ambulatory Visit
Admission: RE | Admit: 2017-03-11 | Discharge: 2017-03-11 | Disposition: A | Discharge status: Home / Self Care | Payer: 59 | Source: Ambulatory Visit | Attending: Internal Medicine | Admitting: Internal Medicine

## 2017-03-11 DIAGNOSIS — R922 Inconclusive mammogram: Secondary | ICD-10-CM | POA: Diagnosis not present

## 2017-03-11 DIAGNOSIS — Z853 Personal history of malignant neoplasm of breast: Secondary | ICD-10-CM

## 2017-03-28 DIAGNOSIS — G35 Multiple sclerosis: Secondary | ICD-10-CM | POA: Diagnosis not present

## 2017-03-28 DIAGNOSIS — S83241D Other tear of medial meniscus, current injury, right knee, subsequent encounter: Secondary | ICD-10-CM | POA: Diagnosis not present

## 2017-04-04 DIAGNOSIS — M1711 Unilateral primary osteoarthritis, right knee: Secondary | ICD-10-CM | POA: Diagnosis not present

## 2017-04-04 DIAGNOSIS — S83241D Other tear of medial meniscus, current injury, right knee, subsequent encounter: Secondary | ICD-10-CM | POA: Diagnosis not present

## 2017-04-09 DIAGNOSIS — M25561 Pain in right knee: Secondary | ICD-10-CM | POA: Diagnosis not present

## 2017-04-16 DIAGNOSIS — M1711 Unilateral primary osteoarthritis, right knee: Secondary | ICD-10-CM | POA: Diagnosis not present

## 2017-04-18 DIAGNOSIS — M25561 Pain in right knee: Secondary | ICD-10-CM | POA: Diagnosis not present

## 2017-04-22 DIAGNOSIS — M25561 Pain in right knee: Secondary | ICD-10-CM | POA: Diagnosis not present

## 2017-04-25 DIAGNOSIS — M25561 Pain in right knee: Secondary | ICD-10-CM | POA: Diagnosis not present

## 2017-05-02 DIAGNOSIS — M25561 Pain in right knee: Secondary | ICD-10-CM | POA: Diagnosis not present

## 2017-05-06 DIAGNOSIS — M25561 Pain in right knee: Secondary | ICD-10-CM | POA: Diagnosis not present

## 2017-05-07 DIAGNOSIS — G35 Multiple sclerosis: Secondary | ICD-10-CM | POA: Diagnosis not present

## 2017-05-09 DIAGNOSIS — M25561 Pain in right knee: Secondary | ICD-10-CM | POA: Diagnosis not present

## 2017-05-16 DIAGNOSIS — M25561 Pain in right knee: Secondary | ICD-10-CM | POA: Diagnosis not present

## 2017-11-07 DIAGNOSIS — G35 Multiple sclerosis: Secondary | ICD-10-CM | POA: Diagnosis not present

## 2017-11-27 DIAGNOSIS — G35 Multiple sclerosis: Secondary | ICD-10-CM | POA: Diagnosis not present

## 2017-11-27 DIAGNOSIS — R5383 Other fatigue: Secondary | ICD-10-CM | POA: Diagnosis not present

## 2017-11-27 DIAGNOSIS — R279 Unspecified lack of coordination: Secondary | ICD-10-CM | POA: Diagnosis not present

## 2017-11-27 DIAGNOSIS — Z9181 History of falling: Secondary | ICD-10-CM | POA: Diagnosis not present

## 2017-11-27 DIAGNOSIS — R269 Unspecified abnormalities of gait and mobility: Secondary | ICD-10-CM | POA: Diagnosis not present

## 2017-12-31 DIAGNOSIS — G35 Multiple sclerosis: Secondary | ICD-10-CM | POA: Diagnosis not present

## 2017-12-31 DIAGNOSIS — Z9181 History of falling: Secondary | ICD-10-CM | POA: Diagnosis not present

## 2017-12-31 DIAGNOSIS — R269 Unspecified abnormalities of gait and mobility: Secondary | ICD-10-CM | POA: Diagnosis not present

## 2018-01-01 DIAGNOSIS — R279 Unspecified lack of coordination: Secondary | ICD-10-CM | POA: Diagnosis not present

## 2018-01-01 DIAGNOSIS — R6889 Other general symptoms and signs: Secondary | ICD-10-CM | POA: Diagnosis not present

## 2018-01-01 DIAGNOSIS — G35 Multiple sclerosis: Secondary | ICD-10-CM | POA: Diagnosis not present

## 2018-01-25 DIAGNOSIS — Z23 Encounter for immunization: Secondary | ICD-10-CM | POA: Diagnosis not present

## 2018-01-29 DIAGNOSIS — G35 Multiple sclerosis: Secondary | ICD-10-CM | POA: Diagnosis not present

## 2018-01-29 DIAGNOSIS — Z9181 History of falling: Secondary | ICD-10-CM | POA: Diagnosis not present

## 2018-01-29 DIAGNOSIS — R269 Unspecified abnormalities of gait and mobility: Secondary | ICD-10-CM | POA: Diagnosis not present

## 2018-02-13 ENCOUNTER — Other Ambulatory Visit: Payer: Self-pay | Admitting: Internal Medicine

## 2018-02-13 DIAGNOSIS — Z853 Personal history of malignant neoplasm of breast: Secondary | ICD-10-CM

## 2018-02-19 DIAGNOSIS — Z0000 Encounter for general adult medical examination without abnormal findings: Secondary | ICD-10-CM | POA: Diagnosis not present

## 2018-02-19 DIAGNOSIS — Z13820 Encounter for screening for osteoporosis: Secondary | ICD-10-CM | POA: Diagnosis not present

## 2018-02-24 DIAGNOSIS — G35 Multiple sclerosis: Secondary | ICD-10-CM | POA: Diagnosis not present

## 2018-02-24 DIAGNOSIS — E038 Other specified hypothyroidism: Secondary | ICD-10-CM | POA: Diagnosis not present

## 2018-02-24 DIAGNOSIS — M859 Disorder of bone density and structure, unspecified: Secondary | ICD-10-CM | POA: Diagnosis not present

## 2018-02-24 DIAGNOSIS — Z0000 Encounter for general adult medical examination without abnormal findings: Secondary | ICD-10-CM | POA: Diagnosis not present

## 2018-02-28 DIAGNOSIS — Z1212 Encounter for screening for malignant neoplasm of rectum: Secondary | ICD-10-CM | POA: Diagnosis not present

## 2018-03-12 ENCOUNTER — Ambulatory Visit
Admission: RE | Admit: 2018-03-12 | Discharge: 2018-03-12 | Disposition: A | Discharge status: Home / Self Care | Payer: 59 | Source: Ambulatory Visit | Attending: Internal Medicine | Admitting: Internal Medicine

## 2018-03-12 DIAGNOSIS — Z853 Personal history of malignant neoplasm of breast: Secondary | ICD-10-CM | POA: Diagnosis not present

## 2018-03-12 DIAGNOSIS — R922 Inconclusive mammogram: Secondary | ICD-10-CM | POA: Diagnosis not present

## 2018-03-25 DIAGNOSIS — G250 Essential tremor: Secondary | ICD-10-CM | POA: Diagnosis not present

## 2018-03-25 DIAGNOSIS — R32 Unspecified urinary incontinence: Secondary | ICD-10-CM | POA: Diagnosis not present

## 2018-03-25 DIAGNOSIS — G252 Other specified forms of tremor: Secondary | ICD-10-CM | POA: Diagnosis not present

## 2018-03-25 DIAGNOSIS — G35 Multiple sclerosis: Secondary | ICD-10-CM | POA: Diagnosis not present

## 2018-03-31 DIAGNOSIS — G35 Multiple sclerosis: Secondary | ICD-10-CM | POA: Diagnosis not present

## 2018-04-01 DIAGNOSIS — G35 Multiple sclerosis: Secondary | ICD-10-CM | POA: Diagnosis not present

## 2018-04-02 DIAGNOSIS — G35 Multiple sclerosis: Secondary | ICD-10-CM | POA: Diagnosis not present

## 2018-05-29 DIAGNOSIS — G35 Multiple sclerosis: Secondary | ICD-10-CM | POA: Diagnosis not present

## 2019-02-09 ENCOUNTER — Other Ambulatory Visit: Payer: Self-pay | Admitting: Internal Medicine

## 2019-02-09 DIAGNOSIS — Z853 Personal history of malignant neoplasm of breast: Secondary | ICD-10-CM

## 2019-03-16 ENCOUNTER — Other Ambulatory Visit: Payer: Self-pay

## 2019-03-16 ENCOUNTER — Ambulatory Visit
Admission: RE | Admit: 2019-03-16 | Discharge: 2019-03-16 | Disposition: A | Discharge status: Home / Self Care | Payer: 59 | Source: Ambulatory Visit | Attending: Internal Medicine | Admitting: Internal Medicine

## 2019-03-16 DIAGNOSIS — Z853 Personal history of malignant neoplasm of breast: Secondary | ICD-10-CM

## 2019-03-27 ENCOUNTER — Encounter: Payer: Self-pay | Admitting: Internal Medicine

## 2019-05-11 ENCOUNTER — Encounter: Payer: Self-pay | Admitting: Internal Medicine

## 2019-05-11 ENCOUNTER — Ambulatory Visit (INDEPENDENT_AMBULATORY_CARE_PROVIDER_SITE_OTHER): Payer: 59 | Admitting: Internal Medicine

## 2019-05-11 VITALS — BP 116/80 | HR 64 | Temp 98.0°F | Ht 60.5 in | Wt 111.2 lb

## 2019-05-11 DIAGNOSIS — R195 Other fecal abnormalities: Secondary | ICD-10-CM

## 2019-05-11 NOTE — Progress Notes (Signed)
HISTORY OF PRESENT ILLNESS:  Amanda Li is a 63 y.o. female, retired Dealer, with multiple sclerosis who is sent today by her primary care provider Dr. Carmie Kanner regarding Hemoccult positive stool.  Patient underwent Hemoccult testing as part of her annual evaluation.  This was initially performed March 05, 2019 and returned positive.  The test was repeated March 24, 2019 and returned positive x3.  Review of blood work from February 23, 2019 finds a normal hemoglobin of 12.6.  MCV 99.9.  Patient denies melena or hematochezia.  She did undergo complete colonoscopy March 25, 2014 for routine screening.  She was found to have 2 diminutive colon polyps which were not adenomatous.  Follow-up in 10 years recommended.  There is no family history of colon cancer.  Patient denies any particular GI complaints, upper or lower.  She does take Aleve twice daily for knee pain.  She is not on PPI.  She is accompanied today by her husband.  They have questions regarding various aspects of Cologuard testing.  REVIEW OF SYSTEMS:  All non-GI ROS negative unless otherwise stated in the HPI except for arthritis  Past Medical History:  Diagnosis Date  . Allergy    seasonal  . Anxiety   . Breast cancer ([place])   . Cancer [facility])    right breast cancer   . Colon polyps   . Dental crowns present   . Depression   . Hypothyroidism   . Joint pain 02/2015   legs  . Multiple sclerosis ([place])   . Osteopenia   . PONV (postoperative nausea and vomiting)   . Thyroid disease    hypothyroid  . Urinary frequency     Past Surgical History:  Procedure Laterality Date  . BREAST LUMPECTOMY Right 03/2015  . BREAST LUMPECTOMY WITH RADIOACTIVE SEED LOCALIZATION Right 03/25/2015   Procedure: BREAST LUMPECTOMY WITH RADIOACTIVE SEED LOCALIZATION;  Surgeon: Autumn Messing III, MD;  Location: [facility];  Service: General;  Laterality: Right;  . CESAREAN SECTION     x 2  . COLONOSCOPY WITH  PROPOFOL  03/25/2014  . KNEE ARTHROSCOPY WITH LATERAL MENISECTOMY Right 09/27/2016   Procedure: Right knee arthroscopy, partial lateral menisectomy, debridement;  Surgeon: Susa Day, MD;  Location: [facility];  Service: Orthopedics;  Laterality: Right;  requests 60 mins  . LAPAROSCOPIC ABDOMINAL EXPLORATION      Social History Amanda Li  reports that she has never smoked. She has never used smokeless tobacco. She reports current alcohol use of about 7.0 standard drinks of alcohol per week. She reports that she does not use drugs.  family history includes Heart disease in her mother; Lung cancer in her father.  Allergies  Allergen Reactions  . Fentanyl Nausea And Vomiting  . Penicillins Swelling    FEET Has patient had a PCN reaction causing immediate rash, facial/tongue/throat swelling, SOB or lightheadedness with hypotension: No Has patient had a PCN reaction causing severe rash involving mucus membranes or skin necrosis: No Has patient had a PCN reaction that required hospitalization No Has patient had a PCN reaction occurring within the last 10 years: No If all of the above answers are "NO", then may proceed with Cephalosporin use.        PHYSICAL EXAMINATION: Vital signs: BP 116/80 (BP Location: Left Arm, Patient Position: Sitting, Cuff Size: Normal)   Pulse 64   Temp 98 F (36.7 C)   Ht 5' 0.5" (1.537 m) Comment: height measured without shoes  Wt 111 lb  4 oz (50.5 kg)   BMI 21.37 kg/m   Constitutional: generally well-appearing, no acute distress Psychiatric: alert and oriented x3, cooperative Eyes: extraocular movements intact, anicteric, conjunctiva pink Mouth: oral pharynx moist, no lesions Neck: supple no lymphadenopathy Cardiovascular: heart regular rate and rhythm, no murmur Lungs: clear to auscultation bilaterally Abdomen: soft, nontender, nondistended, no obvious ascites, no peritoneal signs, normal bowel sounds, no organomegaly Rectal: Deferred until  colonoscopy Extremities: no clubbing, cyanosis, or lower extremity edema bilaterally Skin: no lesions on visible extremities Neuro: No focal deficits.  Cranial nerves intact  ASSESSMENT:  1.  Hemoccult positive stool.  Rule out GI mucosal lesion.  Rule out interval colorectal neoplasia.  Rule out NSAID induced GI mucosal ulceration. 2.  Multiple sclerosis 3.  Questions regarding Cologuard answered in detail.  PLAN:  1.  Colonoscopy to evaluate Hemoccult-positive stool.The nature of the procedure, as well as the risks, benefits, and alternatives were carefully and thoroughly reviewed with the patient. Ample time for discussion and questions allowed. The patient understood, was satisfied, and agreed to proceed. 2.  Upper endoscopy to evaluate Hemoccult-positive stool.The nature of the procedure, as well as the risks, benefits, and alternatives were carefully and thoroughly reviewed with the patient. Ample time for discussion and questions allowed. The patient understood, was satisfied, and agreed to proceed. 3.  The patient may need PPI if she is found to have NSAID related upper GI abnormalities for GI mucosal protection moving forward.  We discussed this

## 2019-05-11 NOTE — Patient Instructions (Signed)
I will call you in the next week or so to schedule your endo/colon

## 2019-06-01 ENCOUNTER — Encounter: Payer: Self-pay | Admitting: Internal Medicine

## 2019-06-29 ENCOUNTER — Ambulatory Visit (AMBULATORY_SURGERY_CENTER): Payer: 59 | Admitting: *Deleted

## 2019-06-29 ENCOUNTER — Other Ambulatory Visit: Payer: Self-pay

## 2019-06-29 VITALS — Ht 60.0 in | Wt 110.0 lb

## 2019-06-29 DIAGNOSIS — R195 Other fecal abnormalities: Secondary | ICD-10-CM

## 2019-06-29 MED ORDER — NA SULFATE-K SULFATE-MG SULF 17.5-3.13-1.6 GM/177ML PO SOLN: 1.0000 | Freq: Once | ORAL | 0 refills | Status: AC

## 2019-06-29 NOTE — Progress Notes (Signed)
No egg or soy allergy known to patient  No issues with past sedation with any surgeries  or procedures, no intubation problems  No diet pills per patient No home 02 use per patient  No blood thinners per patient  Pt denies issues with constipation  No A fib or A flutter  EMMI video sent to pt's e mail   Due to the COVID-19 pandemic we are asking patients to follow these guidelines. Please only bring one care partner. Please be aware that your care partner may wait in the car in the parking lot or if they feel like they will be too hot to wait in the car, they may wait in the lobby on the 4th floor. All care partners are required to wear a mask the entire time (we do not have any that we can provide them), they need to practice social distancing, and we will do a Covid check for all patient's and care partners when you arrive. Also we will check their temperature and your temperature. If the care partner waits in their car they need to stay in the parking lot the entire time and we will call them on their cell phone when the patient is ready for discharge so they can bring the car to the front of the building. Also all patient's will need to wear a mask into building.  Pt verified name, DOB, address and insurance during PV today. Pt mailed instruction packet to included paper to complete and mail back to [facility] with addressed and stamped envelope, Emmi video, copy of consent form to read and not return, and instructions.  coupon mailed in packet. PV completed over the phone. Pt encouraged to call with questions or issues

## 2019-07-07 ENCOUNTER — Encounter: Payer: Self-pay | Admitting: Internal Medicine

## 2019-07-08 ENCOUNTER — Other Ambulatory Visit: Payer: Self-pay | Admitting: Internal Medicine

## 2019-07-09 LAB — SARS CORONAVIRUS 2 (TAT 6-24 HRS): SARS Coronavirus 2: NEGATIVE

## 2019-07-13 ENCOUNTER — Ambulatory Visit (AMBULATORY_SURGERY_CENTER): Payer: 59 | Admitting: Internal Medicine

## 2019-07-13 ENCOUNTER — Other Ambulatory Visit: Payer: Self-pay

## 2019-07-13 ENCOUNTER — Encounter: Payer: Self-pay | Admitting: Internal Medicine

## 2019-07-13 VITALS — BP 121/69 | HR 95 | Temp 97.7°F | Resp 13 | Ht 62.0 in

## 2019-07-13 DIAGNOSIS — K253 Acute gastric ulcer without hemorrhage or perforation: Secondary | ICD-10-CM

## 2019-07-13 DIAGNOSIS — R1310 Dysphagia, unspecified: Secondary | ICD-10-CM | POA: Diagnosis not present

## 2019-07-13 DIAGNOSIS — K5730 Diverticulosis of large intestine without perforation or abscess without bleeding: Secondary | ICD-10-CM

## 2019-07-13 DIAGNOSIS — K449 Diaphragmatic hernia without obstruction or gangrene: Secondary | ICD-10-CM | POA: Diagnosis not present

## 2019-07-13 DIAGNOSIS — K219 Gastro-esophageal reflux disease without esophagitis: Secondary | ICD-10-CM

## 2019-07-13 DIAGNOSIS — R195 Other fecal abnormalities: Secondary | ICD-10-CM | POA: Diagnosis not present

## 2019-07-13 DIAGNOSIS — R1319 Other dysphagia: Secondary | ICD-10-CM

## 2019-07-13 HISTORY — PX: UPPER GASTROINTESTINAL ENDOSCOPY: SHX188

## 2019-07-13 HISTORY — PX: COLONOSCOPY: SHX174

## 2019-07-13 MED ORDER — PANTOPRAZOLE SODIUM 40 MG PO TBEC: 40.0000 mg | DELAYED_RELEASE_TABLET | Freq: Every day | ORAL | 11 refills | Status: AC

## 2019-07-13 MED ORDER — SODIUM CHLORIDE 0.9 % IV SOLN: 500.0000 mL | Freq: Once | INTRAVENOUS | Status: DC

## 2019-07-13 NOTE — Progress Notes (Signed)
Called to room to assist during endoscopic procedure.  Patient ID and intended procedure confirmed with present staff. Received instructions for my participation in the procedure from the performing physician.  

## 2019-07-13 NOTE — Op Note (Signed)
[place] Patient Name: Amanda Li Procedure Date: 07/13/2019 10:43 AM MRN: [id number] Endoscopist: Docia Chuck. Henrene Pastor , MD Age: 64 Referring MD:  Date of Birth: 1956-03-25 Gender: Female Account #: [id number] Procedure:                Colonoscopy Indications:              Heme positive stool. Previous examination November                            2015 (no neoplasia) Medicines:                Monitored Anesthesia Care Procedure:                Pre-Anesthesia Assessment:                           - Prior to the procedure, a History and Physical                            was performed, and patient medications and                            allergies were reviewed. The patient's tolerance of                            previous anesthesia was also reviewed. The risks                            and benefits of the procedure and the sedation                            options and risks were discussed with the patient.                            All questions were answered, and informed consent                            was obtained. Prior Anticoagulants: The patient has                            taken no previous anticoagulant or antiplatelet                            agents. ASA Grade Assessment: II - A patient with                            mild systemic disease. After reviewing the risks                            and benefits, the patient was deemed in                            satisfactory condition to undergo the procedure.  After obtaining informed consent, the colonoscope                            was passed under direct vision. Throughout the                            procedure, the patient's blood pressure, pulse, and                            oxygen saturations were monitored continuously. The                            Colonoscope was introduced through the anus and                            advanced to the the cecum, identified  by                            appendiceal orifice and ileocecal valve. The                            ileocecal valve, appendiceal orifice, and rectum                            were photographed. The quality of the bowel                            preparation was excellent. The colonoscopy was                            performed without difficulty. The patient tolerated                            the procedure well. The bowel preparation used was                            SUPREP via split dose instruction. Scope In: 10:54:23 AM Scope Out: 11:12:29 AM Scope Withdrawal Time: 0 hours 11 minutes 1 second  Total Procedure Duration: 0 hours 18 minutes 6 seconds  Findings:                 A few diverticula were found in the sigmoid colon                            and ascending colon.                           The exam was otherwise without abnormality on                            direct and retroflexion views. Complications:            No immediate complications. Estimated blood loss:  None. Estimated Blood Loss:     Estimated blood loss: none. Impression:               - Diverticulosis in the sigmoid colon and in the                            ascending colon.                           - The examination was otherwise normal on direct                            and retroflexion views.                           - No specimens collected. Recommendation:           - Repeat colonoscopy in 10 years for screening                            purposes.                           - Patient has a contact number available for                            emergencies. The signs and symptoms of potential                            delayed complications were discussed with the                            patient. Return to normal activities tomorrow.                            Written discharge instructions were provided to the                            patient.                            - Resume previous diet.                           - Continue present medications. Docia Chuck. Henrene Pastor, MD 07/13/2019 11:17:02 AM This report has been signed electronically.

## 2019-07-13 NOTE — Op Note (Signed)
[place] Patient Name: Amanda Li Procedure Date: 07/13/2019 10:43 AM MRN: [id number] Endoscopist: Docia Chuck. Henrene Pastor , MD Age: 64 Referring MD:  Date of Birth: 1955/12/19 Gender: Female Account #: [id number] Procedure:                Upper GI endoscopy with biopsies Indications:              Dysphagia, Heme positive stool Medicines:                Monitored Anesthesia Care Procedure:                Pre-Anesthesia Assessment:                           - Prior to the procedure, a History and Physical                            was performed, and patient medications and                            allergies were reviewed. The patient's tolerance of                            previous anesthesia was also reviewed. The risks                            and benefits of the procedure and the sedation                            options and risks were discussed with the patient.                            All questions were answered, and informed consent                            was obtained. Prior Anticoagulants: The patient has                            taken no previous anticoagulant or antiplatelet                            agents. ASA Grade Assessment: II - A patient with                            mild systemic disease. After reviewing the risks                            and benefits, the patient was deemed in                            satisfactory condition to undergo the procedure.                           After obtaining informed consent, the endoscope was  passed under direct vision. Throughout the                            procedure, the patient's blood pressure, pulse, and                            oxygen saturations were monitored continuously. The                            Endoscope was introduced through the mouth, and                            advanced to the second part of duodenum. The upper                            GI  endoscopy was accomplished without difficulty.                            The patient tolerated the procedure well. Scope In: Scope Out: Findings:                 Reflux esophagitis was found at the                            gastroesophageal junction as manifested by edema                            and mild friability.                           One superficial gastric ulcer was found in the                            prepyloric region of the stomach. The lesion was 5                            mm in largest dimension. Biopsies were taken with a                            cold forceps for Helicobacter pylori testing using                            CLOtest.                           The stomach was otherwise normal. Small hiatal                            hernia present.                           The examined duodenum was normal.                           The cardia and gastric fundus were normal on  retroflexion. Complications:            No immediate complications. Estimated Blood Loss:     Estimated blood loss: none. Impression:               - Reflux esophagitis.                           - Gastric ulcer. Biopsied. This explains the                            microscopic blood in your stool.                           - Normal stomach otherwise.                           - Normal examined duodenum. Recommendation:           1. Prescribe pantoprazole 40 mg daily; #30; 11                            refills                           2. Avoid NSAIDs such as Aleve if possible                           3. Tylenol okay for pain                           4. Follow-up CLO biopsy                           5. Office follow-up with Dr. Henrene Pastor in about 6 weeks. Docia Chuck. Henrene Pastor, MD 07/13/2019 11:32:57 AM This report has been signed electronically.

## 2019-07-13 NOTE — Progress Notes (Signed)
Pt's states no medical or surgical changes since previsit or office visit.  Temp LC Vitals DT 

## 2019-07-13 NOTE — Patient Instructions (Signed)
Gastric ulcer and Esophagitis found on upper Endoscopy ,Colonoscopy normal with only finding -diverticulosis  Pick up Pantoprazole 40 mg daily by mouth - to heal gastric ulcer seen  Today  Avoid Aspirin ,Aleve ,Advil ,Ibuprofen, or other non steroidal anti-inflammatory medications if possible- Tylenol is Ok to take   Office follow up with Dr Henrene Pastor in about 6 weeks - make this appointment   Information on Reflux given to you today      YOU HAD AN ENDOSCOPIC PROCEDURE TODAY AT [facility]:   Refer to the procedure report that was given to you for any specific questions about what was found during the examination.  If the procedure report does not answer your questions, please call your gastroenterologist to clarify.  If you requested that your care partner not be given the details of your procedure findings, then the procedure report has been included in a sealed envelope for you to review at your convenience later.  YOU SHOULD EXPECT: Some feelings of bloating in the abdomen. Passage of more gas than usual.  Walking can help get rid of the air that was put into your GI tract during the procedure and reduce the bloating. If you had a lower endoscopy (such as a colonoscopy or flexible sigmoidoscopy) you may notice spotting of blood in your stool or on the toilet paper. If you underwent a bowel prep for your procedure, you may not have a normal bowel movement for a few days.  Please Note:  You might notice some irritation and congestion in your nose or some drainage.  This is from the oxygen used during your procedure.  There is no need for concern and it should clear up in a day or so.  SYMPTOMS TO REPORT IMMEDIATELY:   Following lower endoscopy (colonoscopy or flexible sigmoidoscopy):  Excessive amounts of blood in the stool  Significant tenderness or worsening of abdominal pains  Swelling of the abdomen that is new, acute  Fever of 100F or higher   Following upper  endoscopy (EGD)  Vomiting of blood or coffee ground material  New chest pain or pain under the shoulder blades  Painful or persistently difficult swallowing  New shortness of breath  Fever of 100F or higher  Black, tarry-looking stools  For urgent or emergent issues, a gastroenterologist can be reached at any hour by calling [phone].   DIET:  We do recommend a small meal at first, but then you may proceed to your regular diet.  Drink plenty of fluids but you should avoid alcoholic beverages for 24 hours.  ACTIVITY:  You should plan to take it easy for the rest of today and you should NOT DRIVE or use heavy machinery until tomorrow (because of the sedation medicines used during the test).    FOLLOW UP: Our staff will call the number listed on your records 48-72 hours following your procedure to check on you and address any questions or concerns that you may have regarding the information given to you following your procedure. If we do not reach you, we will leave a message.  We will attempt to reach you two times.  During this call, we will ask if you have developed any symptoms of COVID 19. If you develop any symptoms (ie: fever, flu-like symptoms, shortness of breath, cough etc.) before then, please call [phone].  If you test positive for Covid 19 in the 2 weeks post procedure, please call and report this information to [place].  If any biopsies were taken you will be contacted by phone or by letter within the next 1-3 weeks.  Please call us at [phone] if you have not heard about the biopsies in 3 weeks.    SIGNATURES/CONFIDENTIALITY: You and/or your care partner have signed paperwork which will be entered into your electronic medical record.  These signatures attest to the fact that that the information above on your After Visit Summary has been reviewed and is understood.  Full responsibility of the confidentiality of this discharge information lies with you and/or your  care-partner.

## 2019-07-13 NOTE — Progress Notes (Signed)
Report to PACU, RN, vss, BBS= Clear.  

## 2019-07-14 LAB — HELICOBACTER PYLORI SCREEN-BIOPSY: UREASE: NEGATIVE

## 2019-07-15 ENCOUNTER — Telehealth: Payer: Self-pay | Admitting: *Deleted

## 2019-07-15 NOTE — Telephone Encounter (Signed)
  Follow up Call-  Call back number 07/13/2019  Post procedure Call Back phone  # [phone]  Permission to leave phone message Yes  Some recent data might be hidden     Patient questions:  Do you have a fever, pain , or abdominal swelling? No. Pain Score  0 *  Have you tolerated food without any problems? Yes.    Have you been able to return to your normal activities? Yes.    Do you have any questions about your discharge instructions: Diet   No. Medications  No. Follow up visit  No.  Do you have questions or concerns about your Care? No.  Actions: * If pain score is 4 or above: No action needed, pain <4.  1. Have you developed a fever since your procedure? no  2.   Have you had an respiratory symptoms (SOB or cough) since your procedure? no  3.   Have you tested positive for COVID 19 since your procedure no  4.   Have you had any family members/close contacts diagnosed with the COVID 19 since your procedure?  no   If yes to any of these questions please route to Joylene John, RN and Alphonsa Gin, Therapist, sports.

## 2019-07-15 NOTE — Telephone Encounter (Signed)
  Follow up Call-  Call back number 07/13/2019  Post procedure Call Back phone  # [phone]  Permission to leave phone message Yes  Some recent data might be hidden     Patient questions:  Message left to call us if necessary.

## 2019-08-14 ENCOUNTER — Ambulatory Visit: Payer: 59 | Attending: Internal Medicine

## 2019-08-14 DIAGNOSIS — Z23 Encounter for immunization: Secondary | ICD-10-CM

## 2019-08-14 NOTE — Progress Notes (Signed)
   Covid-19 Vaccination Clinic  Name:  Amanda Li    MRN: [id number] DOB: 09/13/1955  08/14/2019  [name] was observed post Covid-19 immunization for 30 minutes based on pre-vaccination screening without incident. She was provided with Vaccine Information Sheet and instruction to access the V-Safe system.   [name] was instructed to call 911 with any severe reactions post vaccine: Marland Kitchen Difficulty breathing  . Swelling of face and throat  . A fast heartbeat  . A bad rash all over body  . Dizziness and weakness   Immunizations Administered    Name Date Dose VIS Date Route   Pfizer COVID-19 Vaccine 08/14/2019  1:10 PM 0.3 mL 05/01/2019 Intramuscular   Manufacturer: [place]   Lot: [id number]   [place]: [id number]

## 2019-08-25 ENCOUNTER — Ambulatory Visit (INDEPENDENT_AMBULATORY_CARE_PROVIDER_SITE_OTHER): Payer: 59 | Admitting: Internal Medicine

## 2019-08-25 ENCOUNTER — Other Ambulatory Visit: Payer: Self-pay

## 2019-08-25 ENCOUNTER — Encounter: Payer: Self-pay | Admitting: Internal Medicine

## 2019-08-25 VITALS — BP 118/64 | HR 78 | Ht 60.05 in | Wt 113.0 lb

## 2019-08-25 DIAGNOSIS — R1310 Dysphagia, unspecified: Secondary | ICD-10-CM | POA: Diagnosis not present

## 2019-08-25 DIAGNOSIS — K259 Gastric ulcer, unspecified as acute or chronic, without hemorrhage or perforation: Secondary | ICD-10-CM

## 2019-08-25 DIAGNOSIS — K219 Gastro-esophageal reflux disease without esophagitis: Secondary | ICD-10-CM | POA: Diagnosis not present

## 2019-08-25 DIAGNOSIS — R195 Other fecal abnormalities: Secondary | ICD-10-CM | POA: Diagnosis not present

## 2019-08-25 DIAGNOSIS — R1319 Other dysphagia: Secondary | ICD-10-CM

## 2019-08-25 NOTE — Patient Instructions (Signed)
Please follow up as needed 

## 2019-08-25 NOTE — Progress Notes (Signed)
HISTORY OF PRESENT ILLNESS:  Amanda Li is a 64 y.o. female, retired Dealer, with multiple sclerosis who was evaluated May 11, 2019 regarding recurrent Hemoccult positive stool.  See that dictation for details.  She is accompanied today by her husband (also pharmacist).  She underwent colonoscopy and upper endoscopy July 13, 2019.  Complete colonoscopy revealed diverticulosis but was otherwise normal.  Follow-up in 10 years recommended upper endoscopy revealed grade 1 reflux esophagitis with edema and friability as well as a superficial prepyloric gastric ulcer.  Small hiatal hernia.  Testing for Helicobacter pylori was negative.  She was using NSAIDs on a regular basis.  She was prescribed pantoprazole 40 mg daily and asked to follow-up at this time.  She reports that she is doing well.  She is reluctant to take PPI.  She and her husband have concerns and questions over long-term side effects of PPI use.  Dysphagia seemingly improved on PPI.  She is having no overt medication side effects.  They do have a number of excellent questions.  REVIEW OF SYSTEMS:  All non-GI ROS negative unless otherwise stated the HPI except for allergies, anxiety  Past Medical History:  Diagnosis Date  . Allergy    seasonal  . Anxiety   . Breast cancer ([place])   . Cancer [facility])    right breast cancer   . Colon polyps   . Dental crowns present   . Depression   . Hypothyroidism   . Joint pain 02/2015   legs  . Multiple sclerosis ([place])   . Neuromuscular disorder ([place])    have MS  . Osteopenia   . PONV (postoperative nausea and vomiting)   . Thyroid disease    hypothyroid  . Urinary frequency     Past Surgical History:  Procedure Laterality Date  . BREAST LUMPECTOMY Right 03/2015  . BREAST LUMPECTOMY WITH RADIOACTIVE SEED LOCALIZATION Right 03/25/2015   Procedure: BREAST LUMPECTOMY WITH RADIOACTIVE SEED LOCALIZATION;  Surgeon: Autumn Messing III, MD;  Location: [facility];   Service: General;  Laterality: Right;  . CESAREAN SECTION     x 2  . COLONOSCOPY  07/13/2019   2015  . COLONOSCOPY WITH PROPOFOL  03/25/2014  . KNEE ARTHROSCOPY WITH LATERAL MENISECTOMY Right 09/27/2016   Procedure: Right knee arthroscopy, partial lateral menisectomy, debridement;  Surgeon: Susa Day, MD;  Location: [facility];  Service: Orthopedics;  Laterality: Right;  requests 60 mins  . LAPAROSCOPIC ABDOMINAL EXPLORATION    . POLYPECTOMY    . UPPER GASTROINTESTINAL ENDOSCOPY  07/13/2019    Social History Lasheba Scholtz  reports that she has never smoked. She has never used smokeless tobacco. She reports current alcohol use of about 7.0 standard drinks of alcohol per week. She reports that she does not use drugs.  family history includes Heart disease in her mother; Lung cancer in her father.  Allergies  Allergen Reactions  . Fentanyl Nausea And Vomiting  . Penicillins Swelling    FEET Has patient had a PCN reaction causing immediate rash, facial/tongue/throat swelling, SOB or lightheadedness with hypotension: No Has patient had a PCN reaction causing severe rash involving mucus membranes or skin necrosis: No Has patient had a PCN reaction that required hospitalization No Has patient had a PCN reaction occurring within the last 10 years: No If all of the above answers are "NO", then may proceed with Cephalosporin use.        PHYSICAL EXAMINATION: Vital signs: BP 118/64   Pulse 78  Ht 5' 0.05" (1.525 m)   Wt 113 lb (51.3 kg)   BMI 22.03 kg/m   Constitutional: Thin, comfortable, no acute distress Psychiatric: alert and oriented x3, cooperative Eyes: Anicteric Mouth: Mask Abdomen: Not reexamined  ASSESSMENT:  1.  Heme positive stool secondary to gastric ulcer and/or esophagitis 2.  NSAID induced gastric ulcer 3.  GERD with endoscopic evidence of esophagitis and associated dysphagia due to esophageal edema 4.  Multiple general medical problems   PLAN:  1.   Reflux precautions 2.  Avoid unnecessary NSAIDs 3.  I did recommend daily PPI use and follow-up in 1 year.  She is reluctant.  She may decide to use PPI on demand.  She did agree to complete a 12-week course to assure good ulcer healing. 4.  Return to the care of Dr. Brigitte Pulse.  GI follow-up as needed A total time of 30 minutes was spent preparing to see the patient, reviewing test results, obtaining history, performing medically appropriate exam, counseling and educating the patient and her husband regarding her above listed diagnoses, treatment recommendations, and the rationale.  Ordering medications.  Documenting clinical information in the health record and sharing with her primary provider

## 2019-09-09 ENCOUNTER — Ambulatory Visit: Payer: 59 | Attending: Internal Medicine

## 2019-09-09 DIAGNOSIS — Z23 Encounter for immunization: Secondary | ICD-10-CM

## 2019-09-09 NOTE — Progress Notes (Signed)
   Covid-19 Vaccination Clinic  Name:  Arkisha Penfold    MRN: [id number] DOB: October 28, 1955  09/09/2019  Ms. Dull was observed post Covid-19 immunization for 15 minutes without incident. She was provided with Vaccine Information Sheet and instruction to access the V-Safe system.   Ms. Schranz was instructed to call 911 with any severe reactions post vaccine: Marland Kitchen Difficulty breathing  . Swelling of face and throat  . A fast heartbeat  . A bad rash all over body  . Dizziness and weakness   Immunizations Administered    Name Date Dose VIS Date Route   Pfizer COVID-19 Vaccine 09/09/2019  1:15 PM 0.3 mL 07/15/2018 Intramuscular   Manufacturer: [place]   Lot: [id number]   [place]: [id number]

## 2020-02-25 ENCOUNTER — Other Ambulatory Visit: Payer: Self-pay | Admitting: Internal Medicine

## 2020-02-25 DIAGNOSIS — Z1231 Encounter for screening mammogram for malignant neoplasm of breast: Secondary | ICD-10-CM

## 2020-03-10 ENCOUNTER — Ambulatory Visit: Payer: 59 | Attending: Internal Medicine

## 2020-03-10 DIAGNOSIS — Z23 Encounter for immunization: Secondary | ICD-10-CM

## 2020-03-10 NOTE — Progress Notes (Signed)
   Covid-19 Vaccination Clinic  Name:  Amanda Li    MRN: [id number] DOB: 05-03-1956  03/10/2020  [name] was observed post Covid-19 immunization for 15 minutes without incident. She was provided with Vaccine Information Sheet and instruction to access the V-Safe system.   [name] was instructed to call 911 with any severe reactions post vaccine: Marland Kitchen Difficulty breathing  . Swelling of face and throat  . A fast heartbeat  . A bad rash all over body  . Dizziness and weakness

## 2020-03-22 ENCOUNTER — Ambulatory Visit: Payer: 59

## 2020-04-25 ENCOUNTER — Other Ambulatory Visit: Payer: Self-pay

## 2020-04-25 ENCOUNTER — Ambulatory Visit
Admission: RE | Admit: 2020-04-25 | Discharge: 2020-04-25 | Disposition: A | Discharge status: Home / Self Care | Payer: 59 | Source: Ambulatory Visit | Attending: Internal Medicine | Admitting: Internal Medicine

## 2020-04-25 ENCOUNTER — Other Ambulatory Visit: Payer: Self-pay | Admitting: Internal Medicine

## 2020-04-25 DIAGNOSIS — Z853 Personal history of malignant neoplasm of breast: Secondary | ICD-10-CM

## 2020-04-25 DIAGNOSIS — Z1231 Encounter for screening mammogram for malignant neoplasm of breast: Secondary | ICD-10-CM

## 2020-09-26 ENCOUNTER — Other Ambulatory Visit (HOSPITAL_BASED_OUTPATIENT_CLINIC_OR_DEPARTMENT_OTHER): Payer: Self-pay

## 2020-09-26 ENCOUNTER — Ambulatory Visit: Payer: 59 | Attending: Internal Medicine

## 2020-09-26 ENCOUNTER — Other Ambulatory Visit: Payer: Self-pay

## 2020-09-26 DIAGNOSIS — Z23 Encounter for immunization: Secondary | ICD-10-CM

## 2020-09-26 MED ORDER — PFIZER-BIONT COVID-19 VAC-TRIS 30 MCG/0.3ML IM SUSP
INTRAMUSCULAR | 0 refills | Status: AC
  Filled 2020-09-26: qty 0.3, 1d supply, fill #0

## 2020-09-26 NOTE — Progress Notes (Signed)
   Covid-19 Vaccination Clinic  Name:  Amanda Li    MRN: [id number] DOB: 11/08/1955  09/26/2020  [name] was observed post Covid-19 immunization for 15 minutes without incident. She was provided with Vaccine Information Sheet and instruction to access the V-Safe system.   [name] was instructed to call 911 with any severe reactions post vaccine: Marland Kitchen Difficulty breathing  . Swelling of face and throat  . A fast heartbeat  . A bad rash all over body  . Dizziness and weakness   Immunizations Administered    Name Date Dose VIS Date Route   PFIZER Comrnaty([place] TOP) Covid-19 Vaccine 09/26/2020 12:41 PM 0.3 mL 04/28/2020 Intramuscular   Manufacturer: [place]   Lot: [id number]   [facility]: [phone]

## 2021-01-13 ENCOUNTER — Other Ambulatory Visit: Payer: Self-pay | Admitting: Psychiatry

## 2021-01-13 DIAGNOSIS — G35 Multiple sclerosis: Secondary | ICD-10-CM

## 2021-02-09 ENCOUNTER — Ambulatory Visit: Payer: 59 | Attending: Internal Medicine

## 2021-02-09 ENCOUNTER — Other Ambulatory Visit (HOSPITAL_BASED_OUTPATIENT_CLINIC_OR_DEPARTMENT_OTHER): Payer: Self-pay

## 2021-02-09 DIAGNOSIS — Z23 Encounter for immunization: Secondary | ICD-10-CM

## 2021-02-09 MED ORDER — PFIZER COVID-19 VAC BIVALENT 30 MCG/0.3ML IM SUSP
INTRAMUSCULAR | 0 refills | Status: AC
  Filled 2021-02-09: qty 0.3, 1d supply, fill #0

## 2021-02-09 NOTE — Progress Notes (Signed)
   Covid-19 Vaccination Clinic  Name:  CHELISA HENNEN    MRN: [id number] DOB: April 29, 1956  02/09/2021  Ms. Azzarello was observed post Covid-19 immunization for 15 minutes without incident. She was provided with Vaccine Information Sheet and instruction to access the V-Safe system.   Ms. Doddridge was instructed to call 911 with any severe reactions post vaccine: Difficulty breathing  Swelling of face and throat  A fast heartbeat  A bad rash all over body  Dizziness and weakness

## 2021-03-09 ENCOUNTER — Other Ambulatory Visit (HOSPITAL_COMMUNITY): Payer: Self-pay

## 2021-03-15 ENCOUNTER — Other Ambulatory Visit: Payer: Self-pay | Admitting: Internal Medicine

## 2021-03-15 DIAGNOSIS — Z1231 Encounter for screening mammogram for malignant neoplasm of breast: Secondary | ICD-10-CM

## 2021-03-27 ENCOUNTER — Ambulatory Visit
Admission: RE | Admit: 2021-03-27 | Discharge: 2021-03-27 | Disposition: A | Discharge status: Home / Self Care | Payer: 59 | Source: Ambulatory Visit | Attending: Psychiatry | Admitting: Psychiatry

## 2021-03-27 ENCOUNTER — Other Ambulatory Visit: Payer: Self-pay

## 2021-03-27 DIAGNOSIS — G35 Multiple sclerosis: Secondary | ICD-10-CM

## 2021-03-27 MED ORDER — GADOBENATE DIMEGLUMINE 529 MG/ML IV SOLN
10.0000 mL | Freq: Once | INTRAVENOUS | Status: AC | PRN
  Administered 2021-03-27: 10 mL via INTRAVENOUS

## 2021-04-26 ENCOUNTER — Ambulatory Visit
Admission: RE | Admit: 2021-04-26 | Discharge: 2021-04-26 | Disposition: A | Discharge status: Home / Self Care | Payer: Medicare Other | Source: Ambulatory Visit | Attending: Internal Medicine | Admitting: Internal Medicine

## 2021-04-26 DIAGNOSIS — Z1231 Encounter for screening mammogram for malignant neoplasm of breast: Secondary | ICD-10-CM

## 2022-03-15 ENCOUNTER — Other Ambulatory Visit: Payer: Self-pay | Admitting: Internal Medicine

## 2022-03-15 DIAGNOSIS — Z1231 Encounter for screening mammogram for malignant neoplasm of breast: Secondary | ICD-10-CM

## 2022-05-16 ENCOUNTER — Ambulatory Visit: Payer: Medicare Other

## 2022-05-18 ENCOUNTER — Ambulatory Visit
Admission: RE | Admit: 2022-05-18 | Discharge: 2022-05-18 | Disposition: A | Discharge status: Home / Self Care | Payer: Medicare Other | Source: Ambulatory Visit | Attending: Internal Medicine | Admitting: Internal Medicine

## 2022-05-18 DIAGNOSIS — Z1231 Encounter for screening mammogram for malignant neoplasm of breast: Secondary | ICD-10-CM

## 2023-04-30 ENCOUNTER — Other Ambulatory Visit: Payer: Self-pay | Admitting: Internal Medicine

## 2023-04-30 DIAGNOSIS — Z1231 Encounter for screening mammogram for malignant neoplasm of breast: Secondary | ICD-10-CM

## 2023-05-31 ENCOUNTER — Ambulatory Visit: Payer: Medicare Other

## 2023-06-04 ENCOUNTER — Ambulatory Visit: Payer: Medicare Other

## 2023-06-19 ENCOUNTER — Ambulatory Visit
Admission: RE | Admit: 2023-06-19 | Discharge: 2023-06-19 | Disposition: A | Discharge status: Home / Self Care | Payer: Medicare Other | Source: Ambulatory Visit | Attending: Internal Medicine | Admitting: Internal Medicine

## 2023-06-19 DIAGNOSIS — Z1231 Encounter for screening mammogram for malignant neoplasm of breast: Secondary | ICD-10-CM

## 2024-05-26 ENCOUNTER — Other Ambulatory Visit: Payer: Self-pay | Admitting: Internal Medicine

## 2024-05-26 DIAGNOSIS — Z1231 Encounter for screening mammogram for malignant neoplasm of breast: Secondary | ICD-10-CM

## 2024-06-24 ENCOUNTER — Ambulatory Visit
Admission: RE | Admit: 2024-06-24 | Discharge: 2024-06-24 | Disposition: A | Source: Ambulatory Visit | Attending: Internal Medicine | Admitting: Internal Medicine

## 2024-06-24 DIAGNOSIS — Z1231 Encounter for screening mammogram for malignant neoplasm of breast: Secondary | ICD-10-CM
